# Patient Record
Sex: Female | Born: 2000 | Race: White | Hispanic: No | Marital: Single | Smoking: Never smoker
Health system: Southern US, Community
[De-identification: ages and names within clinical notes are randomized; demographics above are authoritative.]

## PROBLEM LIST (undated history)

## (undated) DIAGNOSIS — E079 Disorder of thyroid, unspecified: Secondary | ICD-10-CM

## (undated) HISTORY — DX: Disorder of thyroid, unspecified: E07.9

## (undated) HISTORY — PX: WISDOM TOOTH EXTRACTION: SHX21

---
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
## 2020-04-27 ENCOUNTER — Encounter (HOSPITAL_BASED_OUTPATIENT_CLINIC_OR_DEPARTMENT_OTHER): Payer: Self-pay | Admitting: Nurse Practitioner

## 2020-04-27 ENCOUNTER — Ambulatory Visit (INDEPENDENT_AMBULATORY_CARE_PROVIDER_SITE_OTHER): Payer: BC Managed Care – PPO | Admitting: Nurse Practitioner

## 2020-04-27 ENCOUNTER — Other Ambulatory Visit (HOSPITAL_BASED_OUTPATIENT_CLINIC_OR_DEPARTMENT_OTHER)
Admission: RE | Admit: 2020-04-27 | Discharge: 2020-04-27 | Disposition: A | Discharge status: Home / Self Care | Payer: BC Managed Care – PPO | Source: Ambulatory Visit | Attending: Nurse Practitioner | Admitting: Nurse Practitioner

## 2020-04-27 ENCOUNTER — Other Ambulatory Visit: Payer: Self-pay

## 2020-04-27 VITALS — BP 128/68 | HR 96 | Ht 64.0 in | Wt 196.0 lb

## 2020-04-27 DIAGNOSIS — Z7689 Persons encountering health services in other specified circumstances: Secondary | ICD-10-CM

## 2020-04-27 DIAGNOSIS — Z7184 Encounter for health counseling related to travel: Secondary | ICD-10-CM | POA: Diagnosis not present

## 2020-04-27 DIAGNOSIS — Z6833 Body mass index (BMI) 33.0-33.9, adult: Secondary | ICD-10-CM | POA: Diagnosis not present

## 2020-04-27 DIAGNOSIS — R946 Abnormal results of thyroid function studies: Secondary | ICD-10-CM | POA: Insufficient documentation

## 2020-04-27 DIAGNOSIS — Z0000 Encounter for general adult medical examination without abnormal findings: Secondary | ICD-10-CM

## 2020-04-27 DIAGNOSIS — Z020 Encounter for examination for admission to educational institution: Secondary | ICD-10-CM

## 2020-04-27 DIAGNOSIS — Z6834 Body mass index (BMI) 34.0-34.9, adult: Secondary | ICD-10-CM | POA: Insufficient documentation

## 2020-04-27 HISTORY — DX: Encounter for health counseling related to travel: Z71.84

## 2020-04-27 HISTORY — DX: Persons encountering health services in other specified circumstances: Z76.89

## 2020-04-27 HISTORY — DX: Encounter for examination for admission to educational institution: Z02.0

## 2020-04-27 LAB — TSH: TSH: 8.018 u[IU]/mL — ABNORMAL HIGH (ref 0.350–4.500)

## 2020-04-27 LAB — CBC WITH DIFFERENTIAL/PLATELET
Abs Immature Granulocytes: 0.01 10*3/uL (ref 0.00–0.07)
Basophils Absolute: 0 10*3/uL (ref 0.0–0.1)
Basophils Relative: 1 %
Eosinophils Absolute: 0 10*3/uL (ref 0.0–0.5)
Eosinophils Relative: 1 %
HCT: 32.8 % — ABNORMAL LOW (ref 36.0–46.0)
Hemoglobin: 10.2 g/dL — ABNORMAL LOW (ref 12.0–15.0)
Immature Granulocytes: 0 %
Lymphocytes Relative: 35 %
Lymphs Abs: 1.9 10*3/uL (ref 0.7–4.0)
MCH: 23.7 pg — ABNORMAL LOW (ref 26.0–34.0)
MCHC: 31.1 g/dL (ref 30.0–36.0)
MCV: 76.3 fL — ABNORMAL LOW (ref 80.0–100.0)
Monocytes Absolute: 0.6 10*3/uL (ref 0.1–1.0)
Monocytes Relative: 11 %
Neutro Abs: 2.9 10*3/uL (ref 1.7–7.7)
Neutrophils Relative %: 52 %
Platelets: 274 10*3/uL (ref 150–400)
RBC: 4.3 MIL/uL (ref 3.87–5.11)
RDW: 17 % — ABNORMAL HIGH (ref 11.5–15.5)
WBC: 5.5 10*3/uL (ref 4.0–10.5)
nRBC: 0 % (ref 0.0–0.2)

## 2020-04-27 NOTE — Assessment & Plan Note (Signed)
BMI 33.64 today. Patient is active, health female with no medical history. She is physically active with regular work out routine and good dietary habits. Will check TSH today for possible cause of BMI elevation. Recommend diet low in saturated fats and cholesterol. Continue exercise routine.  Follow-up once back from [place] for CPE.

## 2020-04-27 NOTE — Progress Notes (Signed)
New Patient Office Visit  Subjective:  Patient ID: Oluwaseun Bruyere, female    DOB: 01-16-2001  Age: 20 y.o. MRN: [id number]  CC:  Chief Complaint  Patient presents with  . Establish Care    HPI Artesha Wemhoff is a pleasant 20 year old female presenting today to establish care with this practice. 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 She lives on campus and reports that she feels safe in her environment.  She tells me that she eats a balanced diet and tried to incorporate a variety of food options in her meals. She works out with cardio training 1-2 days a week and weight lifting 3-4 days a week with each session between 30 minutes to an hour.  She denies alcohol, nicotine, or recreational drug use now or ever.  She is not currently sexually active and has no concerns with STI's.  She reports regular menstrual periods historically with no heavy flow or cramping. She was on OCP from about January 2021 through February 2022, but stopped this as she is not sexually active and did not feel the need for it. She does report that she has not had a menstrual period since stopping the medication.  She denies changes to her skin, bowel habits, or bladder habits.   She has not had a dental visit in about 2 years, but plans to make one soon. She does not have any vision problems and does not wear glasses. She has not had a vision exam in the last year.   She is unsure when her last tetanus booster was given, but reports she has been up to date on her vaccines historically.   She is planning to go to [place] to study abroad in September of this year and needs an exam, lab work, and chest x-ray to be cleared to go. She has brought paperwork for this to her office visit today.    History reviewed. No pertinent past medical history.  History reviewed. No pertinent surgical history.  History reviewed. No pertinent family history.  Social History    Socioeconomic History  . Marital status: Single    Spouse name: Not on file  . Number of children: Not on file  . Years of education: Not on file  . Highest education level: Not on file  Occupational History  . Not on file  Tobacco Use  . Smoking status: Not on file  . Smokeless tobacco: Not on file  Substance and Sexual Activity  . Alcohol use: Not on file  . Drug use: Not on file  . Sexual activity: Not on file  Other Topics Concern  . Not on file  Social History Narrative  . Not on file   Social Determinants of Health   Financial Resource Strain: Not on file  Food Insecurity: Not on file  Transportation Needs: Not on file  Physical Activity: Not on file  Stress: Not on file  Social Connections: Not on file  Intimate Partner Violence: Not on file    ROS Review of Systems  Constitutional: Negative for activity change, appetite change, chills, fatigue, fever and unexpected weight change.  HENT: Negative for congestion, hearing loss, rhinorrhea, sinus pressure, sinus pain, sore throat and trouble swallowing.   Eyes: Negative for visual disturbance.  Respiratory: Negative for cough, chest tightness, shortness of breath and wheezing.   Cardiovascular: Negative for chest pain, palpitations and leg swelling.  Gastrointestinal: Negative for abdominal distention, abdominal pain, constipation  and diarrhea.  Genitourinary: Negative for dysuria, flank pain, frequency, hematuria, menstrual problem, vaginal bleeding, vaginal discharge and vaginal pain.  Musculoskeletal: Negative for arthralgias, back pain and gait problem.  Skin: Negative for color change, pallor, rash and wound.  Neurological: Negative for dizziness, syncope, weakness, light-headedness, numbness and headaches.  Hematological: Negative for adenopathy. Does not bruise/bleed easily.  Psychiatric/Behavioral: Negative for decreased concentration, dysphoric mood, self-injury and sleep disturbance. The patient is not  nervous/anxious.     Objective:   Today's Vitals: BP 128/68   Pulse 96   Ht 5\' 4"  (1.626 m)   Wt 196 lb (88.9 kg)   SpO2 100%   BMI 33.64 kg/m   Physical Exam Vitals and nursing note reviewed.  Constitutional:      Appearance: Normal appearance. She is normal weight.  HENT:     Head: Normocephalic.     Right Ear: Tympanic membrane, ear canal and external ear normal.     Left Ear: Tympanic membrane, ear canal and external ear normal.     Nose: Nose normal. No congestion.     Mouth/Throat:     Mouth: Mucous membranes are moist.     Pharynx: Oropharynx is clear.  Eyes:     Extraocular Movements: Extraocular movements intact.     Conjunctiva/sclera: Conjunctivae normal.     Pupils: Pupils are equal, round, and reactive to light.  Neck:     Vascular: No carotid bruit.  Cardiovascular:     Rate and Rhythm: Normal rate and regular rhythm.     Pulses: Normal pulses.     Heart sounds: Normal heart sounds.  Pulmonary:     Effort: Pulmonary effort is normal.     Breath sounds: Normal breath sounds.  Abdominal:     General: Abdomen is flat. Bowel sounds are normal. There is no distension.     Palpations: Abdomen is soft.     Tenderness: There is no abdominal tenderness. There is no right CVA tenderness or left CVA tenderness.  Musculoskeletal:        General: Normal range of motion.     Cervical back: Normal range of motion. No rigidity.     Right lower leg: No edema.     Left lower leg: No edema.  Lymphadenopathy:     Cervical: No cervical adenopathy.  Skin:    General: Skin is warm and dry.     Capillary Refill: Capillary refill takes less than 2 seconds.  Neurological:     General: No focal deficit present.     Mental Status: She is alert and oriented to person, place, and time.  Psychiatric:        Mood and Affect: Mood normal.        Behavior: Behavior normal.        Thought Content: Thought content normal.        Judgment: Judgment normal.     Assessment &  Plan:   Problem List Items Addressed This Visit      Other   Encounter to establish care - Primary    New patient to practice with no medical history.  Review of personal and family medical history, social determinants of health, and physical exam performed today. No concerning findings.  Paperwork for school to be completed when lab and imaging results have been completed.  Will obtain previous records to evaluate for need of vaccinations- tetanus booster may be needed and pt was advised that she may need to return for nurse visit for  this.  Otherwise, follow-up in 1 year for CPE and pap or sooner if needed.       Relevant Orders   CBC with Differential/Platelet (Completed)   Comprehensive metabolic panel   TSH   DG Chest 2 View   Travel advice encounter    Travel planned in September of this year to [place] for educational purposes with plan to study abroad for 12 months.  Discussed recommendations for review of any travel advisories or vaccinations recommended prior to travel to the specific area and to notify the office if she needs assistance getting this.  Examination and labs performed based on recommendations from paperwork provided by educational facility.  No concerns on examination or from mental health standpoint. Recommended chest xray and lab work performed today.  Form to be completed and returned to patient when labs and imaging have returned.       Relevant Orders   CBC with Differential/Platelet (Completed)   Comprehensive metabolic panel   TSH   DG Chest 2 View   Body mass index (BMI) of 33.0-33.9 in adult    BMI 33.64 today. Patient is active, health female with no medical history. She is physically active with regular work out routine and good dietary habits. Will check TSH today for possible cause of BMI elevation. Recommend diet low in saturated fats and cholesterol. Continue exercise routine.  Follow-up once back from [place] for CPE.       Relevant Orders    Comprehensive metabolic panel   TSH   Encounter for school health examination    Examination and physical for clearance to travel to [place] for educational purposes with [facility].  Examination unremarkable, with no concerning findings.  Labs and required chest x-ray ordered.  Will complete form once results have been received.        Relevant Orders   CBC with Differential/Platelet (Completed)   Comprehensive metabolic panel   TSH   DG Chest 2 View      No outpatient encounter medications on file as of 04/27/2020.   No facility-administered encounter medications on file as of 04/27/2020.    Follow-up: Return in about 1 year (around 04/27/2021) for CPE with pap- Lab appointment today, please.   Tollie Eth, NP

## 2020-04-27 NOTE — Assessment & Plan Note (Signed)
Examination and physical for clearance to travel to [place] for educational purposes with [facility].  Examination unremarkable, with no concerning findings.  Labs and required chest x-ray ordered.  Will complete form once results have been received.

## 2020-04-27 NOTE — Assessment & Plan Note (Signed)
Travel planned in September of this year to [place] for educational purposes with plan to study abroad for 12 months.  Discussed recommendations for review of any travel advisories or vaccinations recommended prior to travel to the specific area and to notify the office if she needs assistance getting this.  Examination and labs performed based on recommendations from paperwork provided by educational facility.  No concerns on examination or from mental health standpoint. Recommended chest xray and lab work performed today.  Form to be completed and returned to patient when labs and imaging have returned.

## 2020-04-27 NOTE — Assessment & Plan Note (Signed)
New patient to practice with no medical history.  Review of personal and family medical history, social determinants of health, and physical exam performed today. No concerning findings.  Paperwork for school to be completed when lab and imaging results have been completed.  Will obtain previous records to evaluate for need of vaccinations- tetanus booster may be needed and pt was advised that she may need to return for nurse visit for this.  Otherwise, follow-up in 1 year for CPE and pap or sooner if needed.

## 2020-04-27 NOTE — Patient Instructions (Addendum)
Recommendations from today's visit: We will request your records from your pediatrician office to ensure that we keep up with the care that has been provided.   If we find that you are due for your tetanus vaccination, we will have you come in for a nurse visit to get that sometime before you leave.    Thank you for choosing [facility] for your Primary Care needs. I am excited for the opportunity to partner with you to meet your health care goals. It was a pleasure meeting you today!  I am an Adult-Geriatric Nurse Practitioner with a background in caring for patients for more than 20 years. I received my Paediatric nurse in Nursing and my Doctor of Nursing Practice degrees at [employer]. I received additional fellowship training in primary care and sports medicine after receiving my doctorate degree. I provide primary care and sports medicine services to patients age 21 and older within this office. I am also a provider with the [facility] and the director of the APP Fellowship with [facility].  I am a [place] native, but have called the [place] area home for nearly 20 years and am proud to be a member of this community.   I am passionate about providing the best service to you through preventive medicine and supportive care. I consider you a part of the medical team and value your input. I work diligently to ensure that you are heard and your needs are met in a safe and effective manner. I want you to feel comfortable with me as your provider and want you to know that your health concerns are important to me.   For your information, our office hours are Monday- Friday 8:00 AM - 5:00 PM At this time I am not in the office on Wednesdays.  If you have questions or concerns, please call our office at [phone] or send [place] a MyChart message and we will respond as quickly as possible.   For all urgent or time  sensitive needs we ask that you please call the office to avoid delays. MyChart is not constantly monitored and replies may take up to 72 business hours.  MyChart Policy: . MyChart allows for you to see your visit notes, after visit summary, provider recommendations, lab and tests results, make an appointment, request refills, and contact your provider or the office for non-urgent questions or concerns.  . Providers are seeing patients during normal business hours and do not have built in time to review MyChart messages. We ask that you allow a minimum of 72 business hours for MyChart message responses.  . Complex MyChart concerns may require a visit. Your provider may request you schedule a virtual or in person visit to ensure we are providing the best care possible. . MyChart messages sent after 4:00 PM on Friday will not be received by the provider until Monday morning.    Lab and Test Results: . You will receive your lab and test results on MyChart as soon as they are completed and results have been sent by the lab or testing facility. Due to this service, you will receive your results BEFORE your provider.  . Please allow a minimum of 72 business hours for your provider to receive and review lab and test results and contact you about.   . Most lab and test result comments from the provider will be sent through [place]. Your provider may recommend changes  to the plan of care, follow-up visits, repeat testing, ask questions, or request an office visit to discuss these results. You may reply directly to this message or call the office at [phone] to provide information for the provider or set up an appointment. . In some instances, you will be called with test results and recommendations. Please let us know if this is preferred and we will make note of this in your chart to provide this for you.    . If you have not heard a response to your lab or test results in 72 business hours, please call the  office to let us know.   After Hours: . For all non-emergency after hours needs, please call the office at [phone] and select the option to reach the on-call provider service. On-call services are shared between multiple [facility] offices and therefore it will not be possible to speak directly with your provider. On-call providers may provide medical advice and recommendations, but are unable to provide refills for maintenance medications.  . For all emergency or urgent medical needs after normal business hours, we recommend that you seek care at the closest Urgent Care or Emergency Department to ensure appropriate treatment in a timely manner.  [facility] [place] [facility] has a 24 hour emergency room located on the ground floor for your convenience.    Please do not hesitate to reach out to [place] with concerns.   Thank you, again, for choosing me as your health care partner. I appreciate your trust and look forward to learning more about you.   Worthy Keeler, DNP, AGNP-c ____________________________________________________________________________________________

## 2020-04-30 ENCOUNTER — Other Ambulatory Visit (HOSPITAL_BASED_OUTPATIENT_CLINIC_OR_DEPARTMENT_OTHER)
Admission: RE | Admit: 2020-04-30 | Discharge: 2020-04-30 | Disposition: A | Discharge status: Home / Self Care | Payer: BC Managed Care – PPO | Source: Ambulatory Visit | Attending: Nurse Practitioner | Admitting: Nurse Practitioner

## 2020-04-30 NOTE — Progress Notes (Signed)
Please call lab to add T4 to her labs for further evaluation of TSH levels.   Labs are showing presence of anemia. Recommend iron supplementation of Ferrous Sulfate 4 days a week. We can recheck in June to make sure she is improving before leaving for [place].

## 2020-05-01 ENCOUNTER — Other Ambulatory Visit (HOSPITAL_BASED_OUTPATIENT_CLINIC_OR_DEPARTMENT_OTHER): Payer: Self-pay

## 2020-05-01 ENCOUNTER — Other Ambulatory Visit (HOSPITAL_BASED_OUTPATIENT_CLINIC_OR_DEPARTMENT_OTHER): Payer: Self-pay | Admitting: Nurse Practitioner

## 2020-05-01 DIAGNOSIS — R7989 Other specified abnormal findings of blood chemistry: Secondary | ICD-10-CM

## 2020-05-01 LAB — T4, FREE: Free T4: 0.89 ng/dL (ref 0.61–1.12)

## 2020-05-02 ENCOUNTER — Other Ambulatory Visit: Payer: Self-pay

## 2020-05-02 ENCOUNTER — Other Ambulatory Visit (HOSPITAL_BASED_OUTPATIENT_CLINIC_OR_DEPARTMENT_OTHER): Payer: Self-pay | Admitting: Nurse Practitioner

## 2020-05-02 ENCOUNTER — Other Ambulatory Visit (HOSPITAL_BASED_OUTPATIENT_CLINIC_OR_DEPARTMENT_OTHER)
Admission: RE | Admit: 2020-05-02 | Discharge: 2020-05-02 | Disposition: A | Discharge status: Home / Self Care | Payer: BC Managed Care – PPO | Source: Ambulatory Visit | Attending: Nurse Practitioner | Admitting: Nurse Practitioner

## 2020-05-02 ENCOUNTER — Ambulatory Visit (HOSPITAL_BASED_OUTPATIENT_CLINIC_OR_DEPARTMENT_OTHER)
Admission: RE | Admit: 2020-05-02 | Discharge: 2020-05-02 | Disposition: A | Discharge status: Home / Self Care | Payer: BC Managed Care – PPO | Source: Ambulatory Visit | Attending: Nurse Practitioner | Admitting: Nurse Practitioner

## 2020-05-02 DIAGNOSIS — Z7689 Persons encountering health services in other specified circumstances: Secondary | ICD-10-CM

## 2020-05-02 DIAGNOSIS — Z7184 Encounter for health counseling related to travel: Secondary | ICD-10-CM | POA: Insufficient documentation

## 2020-05-02 DIAGNOSIS — R7989 Other specified abnormal findings of blood chemistry: Secondary | ICD-10-CM

## 2020-05-02 DIAGNOSIS — Z020 Encounter for examination for admission to educational institution: Secondary | ICD-10-CM | POA: Diagnosis present

## 2020-05-02 NOTE — Progress Notes (Signed)
TSH elevated with normal T4 findings on reflex order- adding thyroid peroxidase antibody test for further evaluation. Will await results to determine next course of action.

## 2020-05-02 NOTE — Progress Notes (Signed)
TSH is elevated, T4 normal. Suspect possible Hashimotos- added thyroid peroxidase antibody to labs for further evaluation. Will await results for follow-up.

## 2020-05-03 ENCOUNTER — Telehealth (HOSPITAL_BASED_OUTPATIENT_CLINIC_OR_DEPARTMENT_OTHER): Payer: Self-pay

## 2020-05-03 ENCOUNTER — Other Ambulatory Visit (HOSPITAL_BASED_OUTPATIENT_CLINIC_OR_DEPARTMENT_OTHER): Payer: Self-pay

## 2020-05-03 LAB — THYROID PEROXIDASE ANTIBODY: Thyroperoxidase Ab SerPl-aCnc: 207 IU/mL — ABNORMAL HIGH (ref 0–34)

## 2020-05-03 NOTE — Progress Notes (Signed)
Normal chest x-ray. OK for travel based on results. Will fill out form for completion. We will need to work to stabilize her new Hashimoto's diagnoses before she goes, but this will not inhibit her trip.

## 2020-05-03 NOTE — Telephone Encounter (Signed)
Patient returned phone call regarding lab results.  Scheduled follow up appointment with Shawna Clamp, AGNP.

## 2020-05-03 NOTE — Progress Notes (Signed)
Lab results consistent with Hashimotos Thyroiditis.  Please call patient today to schedule a follow-up in the next week so we can discuss the condition and medication management- this can be in person or MyChart.   Thank you!

## 2020-05-03 NOTE — Telephone Encounter (Signed)
-----   Message from Tollie Eth, NP sent at 05/03/2020  8:11 AM EDT ----- Lab results consistent with Hashimotos Thyroiditis.  Please call patient today to schedule a follow-up in the next week so we can discuss the condition and medication management- this can be in person or MyChart.   Thank you!

## 2020-05-07 ENCOUNTER — Ambulatory Visit (HOSPITAL_BASED_OUTPATIENT_CLINIC_OR_DEPARTMENT_OTHER): Payer: BC Managed Care – PPO | Admitting: Nurse Practitioner

## 2020-05-07 ENCOUNTER — Encounter (HOSPITAL_BASED_OUTPATIENT_CLINIC_OR_DEPARTMENT_OTHER): Payer: Self-pay | Admitting: Nurse Practitioner

## 2020-05-07 ENCOUNTER — Other Ambulatory Visit: Payer: Self-pay

## 2020-05-07 VITALS — BP 109/59 | HR 89 | Ht 64.0 in | Wt 197.6 lb

## 2020-05-07 DIAGNOSIS — Z6833 Body mass index (BMI) 33.0-33.9, adult: Secondary | ICD-10-CM | POA: Diagnosis not present

## 2020-05-07 DIAGNOSIS — E063 Autoimmune thyroiditis: Secondary | ICD-10-CM

## 2020-05-07 DIAGNOSIS — E038 Other specified hypothyroidism: Secondary | ICD-10-CM | POA: Diagnosis not present

## 2020-05-07 MED ORDER — LEVOTHYROXINE SODIUM 150 MCG PO TABS: 150.0000 ug | ORAL_TABLET | Freq: Every day | ORAL | 1 refills | Status: DC

## 2020-05-07 NOTE — Patient Instructions (Addendum)
Recommendations From Today's Visit:  Start Levothyroxine every morning.  You must take this on an empty stomach without any other medications with a glass of water. First thing in the morning is the best time.  Wait at least 30 minutes before eating or drinking anything else to allow the medication to work best.   Levothyroxine works best when you use the same brand of medication. Changing brands can cause variations in how this medication is absorbed. Your pharmacy will notify you if they are out of the brand you usually use. If you must change brands, then we will need to recheck labs after 6 weeks to make sure the dose is still correct.   Follow-Up: We will plan to follow-up with labs  6 weeks to see where your levels are to make sure that this dose is enough. We may have to increase the dose a bit if it is not well controlled at that point. You do not have to come into the office unless you are having problems at that time.     Hypothyroidism  Hypothyroidism is when the thyroid gland does not make enough of certain hormones (it is underactive). The thyroid gland is a small gland located in the lower front part of the neck, just in front of the windpipe (trachea). This gland makes hormones that help control how the body uses food for energy (metabolism) as well as how the heart and brain function. These hormones also play a role in keeping your bones strong. When the thyroid is underactive, it produces too little of the hormones thyroxine (T4) and triiodothyronine (T3). What are the causes? This condition may be caused by:  Hashimoto's disease. This is a disease in which the body's disease-fighting system (immune system) attacks the thyroid gland. This is the most common cause.  Viral infections.  Pregnancy.  Certain medicines.  Birth defects.  Past radiation treatments to the head or neck for cancer.  Past treatment with radioactive iodine.  Past exposure to radiation in  the environment.  Past surgical removal of part or all of the thyroid.  Problems with a gland in the center of the brain (pituitary gland).  Lack of enough iodine in the diet. What increases the risk? You are more likely to develop this condition if:  You are female.  You have a family history of thyroid conditions.  You use a medicine called lithium.  You take medicines that affect the immune system (immunosuppressants). What are the signs or symptoms? Symptoms of this condition include:  Feeling as though you have no energy (lethargy).  Not being able to tolerate cold.  Weight gain that is not explained by a change in diet or exercise habits.  Lack of appetite.  Dry skin.  Coarse hair.  Menstrual irregularity.  Slowing of thought processes.  Constipation.  Sadness or depression. How is this diagnosed? This condition may be diagnosed based on:  Your symptoms, your medical history, and a physical exam.  Blood tests. You may also have imaging tests, such as an ultrasound or MRI. How is this treated? This condition is treated with medicine that replaces the thyroid hormones that your body does not make. After you begin treatment, it may take several weeks for symptoms to go away. Follow these instructions at home:  Take over-the-counter and prescription medicines only as told by your health care provider.  If you start taking any new medicines, tell your health care provider.  Keep all follow-up visits as told  by your health care provider. This is important. ? As your condition improves, your dosage of thyroid hormone medicine may change. ? You will need to have blood tests regularly so that your health care provider can monitor your condition. Contact a health care provider if:  Your symptoms do not get better with treatment.  You are taking thyroid hormone replacement medicine and you: ? Sweat a lot. ? Have tremors. ? Feel anxious. ? Lose weight  rapidly. ? Cannot tolerate heat. ? Have emotional swings. ? Have diarrhea. ? Feel weak. Get help right away if you have:  Chest pain.  An irregular heartbeat.  A rapid heartbeat.  Difficulty breathing. Summary  Hypothyroidism is when the thyroid gland does not make enough of certain hormones (it is underactive).  When the thyroid is underactive, it produces too little of the hormones thyroxine (T4) and triiodothyronine (T3).  The most common cause is Hashimoto's disease, a disease in which the body's disease-fighting system (immune system) attacks the thyroid gland. The condition can also be caused by viral infections, medicine, pregnancy, or past radiation treatment to the head or neck.  Symptoms may include weight gain, dry skin, constipation, feeling as though you do not have energy, and not being able to tolerate cold.  This condition is treated with medicine to replace the thyroid hormones that your body does not make. This information is not intended to replace advice given to you by your health care provider. Make sure you discuss any questions you have with your health care provider. Document Revised: 10/21/2019 Document Reviewed: 10/06/2019 Elsevier Patient Education  2021 Elsevier Inc.    Levothyroxine tablets What is this medicine? LEVOTHYROXINE (lee voe thye ROX een) is a thyroid hormone. This medicine can improve symptoms of thyroid deficiency such as slow speech, lack of energy, weight gain, hair loss, dry skin, and feeling cold. It also helps to treat goiter (an enlarged thyroid gland). It is also used to treat some kinds of thyroid cancer along with surgery and other medicines. This medicine may be used for other purposes; ask your health care provider or pharmacist if you have questions. COMMON BRAND NAME(S): Estre, Euthyrox, Levo-T, Levothroid, Levoxyl, Synthroid, Thyro-Tabs, Unithroid What should I tell my health care provider before I take this medicine? They  need to know if you have any of these conditions:  Addison's disease or other adrenal gland problem  angina  bone problems  diabetes  dieting or on a weight loss program  fertility problems  heart disease  pituitary gland problem  take medicines that treat or prevent blood clots  an unusual or allergic reaction to levothyroxine, thyroid hormones, other medicines, foods, dyes, or preservatives  pregnant or trying to get pregnant  breast-feeding How should I use this medicine? Take this medicine by mouth with plenty of water. It is best to take on an empty stomach, at least 30 minutes to one hour before breakfast. Avoid taking antacids containing aluminum or magnesium, simethicone, bile acid sequestrants, calcium carbonate, sodium polystyrene sulfonate, ferrous sulfate, sevelamer, lanthanum, or sucralfate within 4 hours of taking this medicine. Follow the directions on the prescription label. Take at the same time each day. Do not take your medicine more often than directed. Contact your pediatrician regarding the use of this medicine in children. While this drug may be prescribed for children and infants as young as a few days of age for selected conditions, precautions do apply. For infants, you may crush the tablet and place in a  small amount of (5 to 10 mL or 1 to 2 teaspoonfuls) of water, breast milk, or non-soy based infant formula. Do not mix with soy-based infant formula. Give as directed. Overdosage: If you think you have taken too much of this medicine contact a poison control center or emergency room at once. NOTE: This medicine is only for you. Do not share this medicine with others. What if I miss a dose? If you miss a dose, take it as soon as you can. If it is almost time for your next dose, take only that dose. Do not take double or extra doses. What may interact with this medicine?  amiodarone  antacids  anti-thyroid medicines  calcium  supplements  carbamazepine  certain medicines for depression  certain medicines to treat cancer  cholestyramine  clofibrate  colesevelam  colestipol  digoxin  female hormones, like estrogens and birth control pills, patches, rings, or injections  iron supplements  ketamine  lanthanum  liquid nutrition products like Ensure  lithium  medicines for colds and breathing difficulties  medicines for diabetes  medicines or dietary supplements for weight loss  methadone  niacin  orlistat  oxandrolone  phenobarbital or other barbiturates  phenytoin  rifampin  sevelamer  simethicone  sodium polystyrene sulfonate  soy isoflavones  steroid medicines like prednisone or cortisone  sucralfate  testosterone  theophylline  warfarin This list may not describe all possible interactions. Give your health care provider a list of all the medicines, herbs, non-prescription drugs, or dietary supplements you use. Also tell them if you smoke, drink alcohol, or use illegal drugs. Some items may interact with your medicine. What should I watch for while using this medicine? Be sure to take this medicine with plenty of fluids. Some tablets may cause choking, gagging, or difficulty swallowing from the tablet getting stuck in your throat. Most of these problems disappear if the medicine is taken with the right amount of water or other fluids. Do not switch brands of this medicine unless your health care professional agrees with the change. Ask questions if you are uncertain. You will need regular exams and occasional blood tests to check the response to treatment. If you are receiving this medicine for an underactive thyroid, it may be several weeks before you notice an improvement. Check with your doctor or health care professional if your symptoms do not improve. It may be necessary for you to take this medicine for the rest of your life. Do not stop using this medicine  unless your doctor or health care professional advises you to. This medicine can affect blood sugar levels. If you have diabetes, check your blood sugar as directed. You may lose some of your hair when you first start treatment. With time, this usually corrects itself. If you are going to have surgery, tell your doctor or health care professional that you are taking this medicine. What side effects may I notice from receiving this medicine? Side effects that you should report to your doctor or health care professional as soon as possible:  allergic reactions like skin rash, itching or hives, swelling of the face, lips, or tongue  anxious  breathing problems  changes in menstrual periods  chest pain  diarrhea  excessive sweating or intolerance to heat  fast or irregular heartbeat  leg cramps  nervousness  swelling of ankles, feet, or legs  tremors  trouble sleeping  vomiting Side effects that usually do not require medical attention (report to your doctor or health care  professional if they continue or are bothersome):  changes in appetite  headache  irritable  nausea  weight loss This list may not describe all possible side effects. Call your doctor for medical advice about side effects. You may report side effects to FDA at 1-800-FDA-1088. Where should I keep my medicine? Keep out of the reach of children. Store at room temperature between 15 and 30 degrees C (59 and 86 degrees F). Protect from light and moisture. Keep container tightly closed. Throw away any unused medicine after the expiration date. NOTE: This sheet is a summary. It may not cover all possible information. If you have questions about this medicine, talk to your doctor, pharmacist, or health care provider.  2021 Elsevier/Gold Standard (2019-01-13 13:39:26)

## 2020-05-07 NOTE — Progress Notes (Signed)
Established Patient Office Visit  Subjective:  Patient ID: Kristie Bean, female    DOB: 27-Oct-2000  Age: 20 y.o. MRN: [id number]  CC:  Chief Complaint  Patient presents with  . Follow-up    HPI Kristie Bean presents for follow-up for new diagnosis of hypothyroidism determined with labs from recent visit. 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 She recently had a physical examination with labs as part of evaluation for medical clearance to attend school in [place] for a year as part of an exchange program.  Lab results revealed elevation of TSH with subsequent findings elevated thyroperoxidase antibodies consistent with Hashimoto's thyroiditis.  She does endorse difficulty losing weight with weight gain despite regular physical activity and strict monitoring of her diet.  She also endorses fatigue and dry skin.  She reports that she thought all of these issues were normal for her until she received the lab results.  She does have other family members with Hashimoto's thyroiditis and other autoimmune conditions that she recently discovered.  History reviewed. No pertinent past medical history.  History reviewed. No pertinent surgical history.  Family History  Problem Relation Age of Onset  . Cancer Maternal Aunt   . Diabetes Maternal Aunt     Social History   Socioeconomic History  . Marital status: Single    Spouse name: Not on file  . Number of children: Not on file  . Years of education: Not on file  . Highest education level: Not on file  Occupational History  . Occupation: Ship broker  Tobacco Use  . Smoking status: Never Smoker  . Smokeless tobacco: Never Used  Vaping Use  . Vaping Use: Never used  Substance and Sexual Activity  . Alcohol use: Never  . Drug use: Never  . Sexual activity: Not Currently    Birth control/protection: Abstinence  Other Topics Concern  . Not on file  Social History Narrative   Sophomore at [employer] with plans to travel and study in [place] for 12 months starting this September.    Social Determinants of Health   Financial Resource Strain: Not on file  Food Insecurity: Not on file  Transportation Needs: Not on file  Physical Activity: Sufficiently Active  . Days of Exercise per Week: 7 days  . Minutes of Exercise per Session: 50 min  Stress: Not on file  Social Connections: Not on file  Intimate Partner Violence: Not At Risk  . Fear of Current or Ex-Partner: No  . Emotionally Abused: No  . Physically Abused: No  . Sexually Abused: No    No outpatient medications prior to visit.   No facility-administered medications prior to visit.    Not on File  ROS Review of Systems  Constitutional: Positive for fatigue and unexpected weight change. Negative for activity change and appetite change.  Respiratory: Negative for cough, chest tightness, shortness of breath and wheezing.   Cardiovascular: Negative for chest pain, palpitations and leg swelling.  Gastrointestinal: Negative for abdominal pain, constipation and diarrhea.  Musculoskeletal: Negative for arthralgias and myalgias.  Neurological: Negative for dizziness, tremors, weakness, light-headedness and headaches.  Hematological: Negative for adenopathy. Does not bruise/bleed easily.  Psychiatric/Behavioral: Negative for dysphoric mood, self-injury and sleep disturbance. The patient is not nervous/anxious.       Objective:    Physical Exam Vitals and nursing note reviewed.  Constitutional:      Appearance: Normal appearance.  HENT:     Head: Normocephalic.  Eyes:     Extraocular Movements: Extraocular movements intact.     Conjunctiva/sclera: Conjunctivae normal.     Pupils: Pupils are equal, round, and reactive to light.  Neck:     Vascular: No carotid bruit.  Cardiovascular:     Rate and Rhythm: Normal rate and regular rhythm.     Pulses: Normal pulses.     Heart sounds: Normal  heart sounds. No murmur heard.   Pulmonary:     Effort: Pulmonary effort is normal.     Breath sounds: Normal breath sounds.  Abdominal:     General: Abdomen is flat. Bowel sounds are normal. There is no distension.     Palpations: Abdomen is soft.     Tenderness: There is no abdominal tenderness.  Musculoskeletal:     Cervical back: No rigidity or tenderness.     Right lower leg: No edema.     Left lower leg: No edema.  Lymphadenopathy:     Cervical: No cervical adenopathy.  Skin:    General: Skin is warm and dry.     Capillary Refill: Capillary refill takes less than 2 seconds.  Neurological:     General: No focal deficit present.     Mental Status: She is alert and oriented to person, place, and time.  Psychiatric:        Mood and Affect: Mood normal.        Behavior: Behavior normal.        Thought Content: Thought content normal.        Judgment: Judgment normal.     BP (!) 109/59   Pulse 89   Ht '5\' 4"'  (1.626 m)   Wt 197 lb 9.6 oz (89.6 kg)   SpO2 100%   BMI 33.92 kg/m  Wt Readings from Last 3 Encounters:  05/07/20 197 lb 9.6 oz (89.6 kg)  04/27/20 196 lb (88.9 kg)     Health Maintenance Due  Topic Date Due  . Hepatitis C Screening  Never done  . COVID-19 Vaccine (1) Never done  . HPV VACCINES (1 - 2-dose series) Never done  . HIV Screening  Never done  . TETANUS/TDAP  Never done       Topic Date Due  . HPV VACCINES (1 - 2-dose series) Never done    Lab Results  Component Value Date   TSH 8.018 (H) 04/27/2020   Lab Results  Component Value Date   WBC 5.5 04/27/2020   HGB 10.2 (L) 04/27/2020   HCT 32.8 (L) 04/27/2020   MCV 76.3 (L) 04/27/2020   PLT 274 04/27/2020   No results found for: NA, K, CHLORIDE, CO2, GLUCOSE, BUN, CREATININE, BILITOT, ALKPHOS, AST, ALT, PROT, ALBUMIN, CALCIUM, ANIONGAP, EGFR, GFR No results found for: CHOL No results found for: HDL No results found for: LDLCALC No results found for: TRIG No results found for:  CHOLHDL No results found for: HGBA1C    Assessment & Plan:   Problem List Items Addressed This Visit      Endocrine   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 Patient educated today on this condition as well as recommendations for management. Recommend starting with levothyroxine 150 mg based on weight with follow-up monitoring of TSH levels in approximately 6 weeks. She will need frequent monitoring to help stabilize her as quickly as possible as she is expected to leave in late September for [place] for a full year. She will  have access to medical care in [place] therefore we may not have to have a full years worth of medication available when she leaves however she is going to check on this so that we can come up with the best plan of care prior to her departure. Education information provided and all questions answered today. Follow-up in 6 weeks with labs.      Relevant Medications   levothyroxine (SYNTHROID) 150 MCG tablet   Other Relevant Orders   Thyroid Panel With TSH     Other   Body mass index (BMI) of 33.0-33.9 in adult    BMI 33.92 at visit today. New recent diagnosis of Hashimoto's thyroiditis does help explain elevation in BMI despite regular workout routine and excellent dietary habits. Discussed with the patient that medication management may help with her weight gain, but to monitor for signs of hyperthyroidism and let me know immediately if these changes present.  Will plan to follow-up in 6 weeks with repeat labs.  Recommend continuing current diet and exercise program.          Meds ordered this encounter  Medications  . DISCONTD: levothyroxine (SYNTHROID) 150 MCG tablet    Sig: Take 1 tablet (150 mcg total) by mouth daily before breakfast.    Dispense:  30 tablet    Refill:  1  . levothyroxine (SYNTHROID) 150 MCG tablet    Sig: Take 1 tablet (150 mcg total) by  mouth daily before breakfast.    Dispense:  30 tablet    Refill:  1    Brand specific please    Follow-up: Return in about 6 weeks (around 06/18/2020) for Labs only in 6 weeks.    Orma Render, NP

## 2020-05-07 NOTE — Assessment & Plan Note (Signed)
BMI 33.92 at visit today. New recent diagnosis of Hashimoto's thyroiditis does help explain elevation in BMI despite regular workout routine and excellent dietary habits. Discussed with the patient that medication management may help with her weight gain, but to monitor for signs of hyperthyroidism and let me know immediately if these changes present.  Will plan to follow-up in 6 weeks with repeat labs.  Recommend continuing current diet and exercise program.

## 2020-05-07 NOTE — Assessment & Plan Note (Addendum)
New diagnosis of Hashimoto's thyroiditis with recent laboratory evaluation. Patient educated today on this condition as well as recommendations for management. Recommend starting with levothyroxine 150 mg based on weight with follow-up monitoring of TSH levels in approximately 6 weeks. She will need frequent monitoring to help stabilize her as quickly as possible as she is expected to leave in late September for [place] for a full year. Consult with pharmacist to determine the best course of action for medication management while she is away.  It does appear that we can get a prior authorization from her insurance company to supply her with a full 1 year supply of medication before she leaves.  If there is an issue with her obtaining medical care while she is gone this will be required.  We may also consider mailing her medication in 3 or 6 months supplies to ensure that she does not run out. She will have access to medical care in [place] therefore we may not have to have a full years worth of medication available when she leaves however she is going to check on this so that we can come up with the best plan of care prior to her departure. Education information provided and all questions answered today. Follow-up in 6 weeks with labs.

## 2020-05-08 ENCOUNTER — Encounter (HOSPITAL_BASED_OUTPATIENT_CLINIC_OR_DEPARTMENT_OTHER): Payer: Self-pay | Admitting: Nurse Practitioner

## 2020-05-08 ENCOUNTER — Other Ambulatory Visit (HOSPITAL_BASED_OUTPATIENT_CLINIC_OR_DEPARTMENT_OTHER): Payer: Self-pay | Admitting: Nurse Practitioner

## 2020-05-08 DIAGNOSIS — E038 Other specified hypothyroidism: Secondary | ICD-10-CM

## 2020-05-08 DIAGNOSIS — E063 Autoimmune thyroiditis: Secondary | ICD-10-CM

## 2020-05-08 MED ORDER — LEVOTHYROXINE SODIUM 150 MCG PO TABS: 150.0000 ug | ORAL_TABLET | Freq: Every day | ORAL | 3 refills | Status: DC

## 2020-05-09 ENCOUNTER — Encounter (HOSPITAL_BASED_OUTPATIENT_CLINIC_OR_DEPARTMENT_OTHER): Payer: Self-pay

## 2020-06-29 ENCOUNTER — Other Ambulatory Visit (HOSPITAL_BASED_OUTPATIENT_CLINIC_OR_DEPARTMENT_OTHER): Payer: Self-pay | Admitting: Nurse Practitioner

## 2020-06-29 DIAGNOSIS — E038 Other specified hypothyroidism: Secondary | ICD-10-CM

## 2020-07-31 ENCOUNTER — Other Ambulatory Visit (HOSPITAL_BASED_OUTPATIENT_CLINIC_OR_DEPARTMENT_OTHER)
Admission: RE | Admit: 2020-07-31 | Discharge: 2020-07-31 | Disposition: A | Discharge status: Home / Self Care | Payer: BC Managed Care – PPO | Source: Ambulatory Visit | Attending: Nurse Practitioner | Admitting: Nurse Practitioner

## 2020-07-31 ENCOUNTER — Ambulatory Visit (HOSPITAL_BASED_OUTPATIENT_CLINIC_OR_DEPARTMENT_OTHER): Payer: BC Managed Care – PPO | Admitting: Nurse Practitioner

## 2020-07-31 ENCOUNTER — Encounter (HOSPITAL_BASED_OUTPATIENT_CLINIC_OR_DEPARTMENT_OTHER): Payer: Self-pay | Admitting: Nurse Practitioner

## 2020-07-31 ENCOUNTER — Other Ambulatory Visit: Payer: Self-pay

## 2020-07-31 VITALS — BP 119/63 | HR 76 | Ht 64.0 in | Wt 199.2 lb

## 2020-07-31 DIAGNOSIS — E038 Other specified hypothyroidism: Secondary | ICD-10-CM

## 2020-07-31 DIAGNOSIS — E559 Vitamin D deficiency, unspecified: Secondary | ICD-10-CM

## 2020-07-31 DIAGNOSIS — E063 Autoimmune thyroiditis: Secondary | ICD-10-CM | POA: Diagnosis not present

## 2020-07-31 DIAGNOSIS — D509 Iron deficiency anemia, unspecified: Secondary | ICD-10-CM | POA: Diagnosis not present

## 2020-07-31 NOTE — Progress Notes (Signed)
Established Patient Office Visit  Subjective:  Patient ID: Kristie Bean, female    DOB: 01/15/2001  Age: 20 y.o. MRN: [id number]  CC:  Chief Complaint  Patient presents with   Follow-up    Medication follow up    HPI Kristie Bean presents for hypothyroidism and anemia.  [name] was found to have Hashimoto's thyroiditis after establishing care and initial labs on 04/27/2020.  At that time she was started on levothyroxine 125 mcg/day.  She reports that she has been taking the medication daily at least 1 hour prior to her first meal and has not skipped any doses. She does report that she has some improvement with her stamina however she does not feel that her energy levels have improved much since starting the medication.  She also reports that she has been noticing fluctuations in her weight up to 5 pounds at a time over short periods of time (typically 1 week) she is unsure if this has anything to do with her thyroid or is due to some other factor. She also reports irregular menstrual cycles with no improvement since starting the medication.  She states that her menstrual periods typically last for about 1 week and are particularly heavy at the beginning and middle of her cycle.  She says that this has been a problem for quite some time. She denies thinning hair, intolerance to heat or cold, palpitations, chest pain, dizziness, weakness, mood fluctuations.  [name] was also found to be positive for iron deficiency anemia with her labs in March.  She was started on ferrous sulfate 325 mcg every other day.  She reports she has been taking this medication as directed with no known side effects.  She does report that she has heavy menstrual cycles however they are irregular. She has no other source of bleeding, no black tarry stools, no dark emesis, no bruising.  History reviewed. No pertinent past medical history.  History reviewed. No pertinent surgical history.  Family History   Problem Relation Age of Onset   Diabetes Maternal Aunt    Breast cancer Maternal Aunt    Coronary artery disease Maternal Grandfather     Social History   Socioeconomic History   Marital status: Single    Spouse name: Not on file   Number of children: Not on file   Years of education: Not on file   Highest education level: Not on file  Occupational History   Occupation: Student  Tobacco Use   Smoking status: Never   Smokeless tobacco: Never  Vaping Use   Vaping Use: Never used  Substance and Sexual Activity   Alcohol use: Never   Drug use: Never   Sexual activity: Not Currently    Birth control/protection: Abstinence  Other Topics Concern   Not on file  Social History Narrative   Sophomore at [employer] with plans to travel and study in [place] for 12 months starting this September.    Social Determinants of Health   Financial Resource Strain: Not on file  Food Insecurity: Not on file  Transportation Needs: Not on file  Physical Activity: Sufficiently Active   Days of Exercise per Week: 7 days   Minutes of Exercise per Session: 50 min  Stress: Not on file  Social Connections: Not on file  Intimate Partner Violence: Not At Risk   Fear of Current or Ex-Partner: No   Emotionally Abused: No   Physically Abused: No   Sexually Abused: No    Outpatient  Medications Prior to Visit  Medication Sig Dispense Refill   levothyroxine (SYNTHROID) 150 MCG tablet Take 1 tablet (150 mcg total) by mouth daily before breakfast. 90 tablet 3   No facility-administered medications prior to visit.    Not on File  ROS Review of Systems  Constitutional:  Positive for fatigue and unexpected weight change. Negative for activity change, appetite change and fever.  Respiratory:  Negative for chest tightness and shortness of breath.   Cardiovascular:  Negative for chest pain, palpitations and leg swelling.  Gastrointestinal:  Negative for abdominal pain.   Endocrine: Negative for cold intolerance, heat intolerance, polydipsia, polyphagia and polyuria.  Genitourinary:  Positive for menstrual problem. Negative for dysuria and frequency.       Irregular menses   Skin:  Negative for pallor.  Neurological:  Negative for dizziness, weakness and headaches.  Psychiatric/Behavioral:  Negative for dysphoric mood and sleep disturbance. The patient is not nervous/anxious.      Objective:    Physical Exam Vitals and nursing note reviewed.  Constitutional:      Appearance: Normal appearance.  HENT:     Head: Normocephalic and atraumatic.  Eyes:     Extraocular Movements: Extraocular movements intact.     Conjunctiva/sclera: Conjunctivae normal.     Pupils: Pupils are equal, round, and reactive to light.  Cardiovascular:     Rate and Rhythm: Normal rate and regular rhythm.     Pulses: Normal pulses.     Heart sounds: Normal heart sounds.  Pulmonary:     Effort: Pulmonary effort is normal.     Breath sounds: Normal breath sounds.  Abdominal:     General: Abdomen is flat. Bowel sounds are normal.     Palpations: Abdomen is soft.  Musculoskeletal:        General: Normal range of motion.     Cervical back: Normal range of motion.     Right lower leg: No edema.     Left lower leg: No edema.  Skin:    General: Skin is warm and dry.     Capillary Refill: Capillary refill takes less than 2 seconds.  Neurological:     General: No focal deficit present.     Mental Status: She is alert and oriented to person, place, and time.  Psychiatric:        Mood and Affect: Mood normal.        Behavior: Behavior normal.        Thought Content: Thought content normal.        Judgment: Judgment normal.    BP 119/63   Pulse 76   Ht '5\' 4"'  (1.626 m)   Wt 199 lb 3.2 oz (90.4 kg)   BMI 34.19 kg/m  Wt Readings from Last 3 Encounters:  07/31/20 199 lb 3.2 oz (90.4 kg)  05/07/20 197 lb 9.6 oz (89.6 kg)  04/27/20 196 lb (88.9 kg)     Health Maintenance  Due  Topic Date Due   HPV VACCINES (1 - 2-dose series) Never done   HIV Screening  Never done   Hepatitis C Screening  Never done   TETANUS/TDAP  Never done   COVID-19 Vaccine (3 - Booster for Moderna series) 10/20/2019   PAP-Cervical Cytology Screening  06/20/2020   PAP SMEAR-Modifier  06/20/2020       Topic Date Due   HPV VACCINES (1 - 2-dose series) Never done    Lab Results  Component Value Date   TSH 8.018 (H) 04/27/2020  Lab Results  Component Value Date   WBC 5.5 04/27/2020   HGB 10.2 (L) 04/27/2020   HCT 32.8 (L) 04/27/2020   MCV 76.3 (L) 04/27/2020   PLT 274 04/27/2020   No results found for: NA, K, CHLORIDE, CO2, GLUCOSE, BUN, CREATININE, BILITOT, ALKPHOS, AST, ALT, PROT, ALBUMIN, CALCIUM, ANIONGAP, EGFR, GFR No results found for: CHOL No results found for: HDL No results found for: LDLCALC No results found for: TRIG No results found for: CHOLHDL No results found for: HGBA1C    Assessment & Plan:   Problem List Items Addressed This Visit     Hypothyroidism due to Hashimoto's thyroiditis   Other Visit Diagnoses     Iron deficiency anemia, unspecified iron deficiency anemia type    -  Primary   Relevant Orders   CBC with Differential       No orders of the defined types were placed in this encounter.   Follow-up: Return in about 6 weeks (around 09/11/2020) for Thyroid/Anemia.    Orma Render, NP

## 2020-07-31 NOTE — Assessment & Plan Note (Signed)
New diagnosis of Hashimoto's thyroiditis in late March of this year. Started on levothyroxine 150 mcg on 05/07/2020. Has not noticed any significant improvement in symptoms since starting the medication.  We will check labs today. Will make changes to medication and dosage based on laboratory results.  Suspect that we will likely need to increase the dose. 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 She will need to have a 3 to 27-month supply of medication initially sent with her to get her through until mail order prescriptions can be sent.  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 Plan to follow-up in 6 weeks for labs and reevaluation.

## 2020-07-31 NOTE — Patient Instructions (Addendum)
Let's plan to see what your labs say and then we will have time to make any changes. If we need to make changes I will let you know.   I recommend that you continue to take the iron supplement at least until we get the labs back. You can continue three times a week as you have been  I recommend taking Vitamin D supplement 800iU per day at the opposite time of day as your iron.  This will help with low Vitamin D levels and protect your immune system a little while traveling.   If you decide that you would like to try a birth control option to help control your periods better, please let me know and I can send this in for you.

## 2020-07-31 NOTE — Assessment & Plan Note (Signed)
History of vitamin D deficiency. Recommend 800 IU of vitamin D daily to be added to her medication regimen to help prevent worsening of this condition particularly since she is getting ready to leave the country for an extended period of time.  We will plan to recheck vitamin D labs at her next appointment in 6 weeks.

## 2020-07-31 NOTE — Assessment & Plan Note (Signed)
Recent diagnosis of iron deficiency anemia started on ferrous sulfate 325 mg 3 days a week.  At this time patient has been taking medication for 6 weeks. Labs drawn today. Recommend continue medication until lab results have returned. Suspect that the patient will need to remain on medication given that she is having irregular and heavy menstrual bleeding. Discussed oral contraceptive management with the patient to help stabilize her menstrual cycle.  At this time she wishes to think about this. I am hopeful that stabilization of her thyroid condition will result in normalization of her menses. Follow-up in 6 weeks.

## 2020-08-01 ENCOUNTER — Other Ambulatory Visit (HOSPITAL_BASED_OUTPATIENT_CLINIC_OR_DEPARTMENT_OTHER): Payer: Self-pay | Admitting: Nurse Practitioner

## 2020-08-01 ENCOUNTER — Telehealth (HOSPITAL_BASED_OUTPATIENT_CLINIC_OR_DEPARTMENT_OTHER): Payer: Self-pay

## 2020-08-01 DIAGNOSIS — E038 Other specified hypothyroidism: Secondary | ICD-10-CM

## 2020-08-01 LAB — THYROID PANEL WITH TSH
Free Thyroxine Index: 5.3 — ABNORMAL HIGH (ref 1.2–4.9)
T3 Uptake Ratio: 36 % (ref 24–39)
T4, Total: 14.8 ug/dL — ABNORMAL HIGH (ref 4.5–12.0)
TSH: 0.009 u[IU]/mL — ABNORMAL LOW (ref 0.450–4.500)

## 2020-08-01 LAB — CBC WITH DIFFERENTIAL/PLATELET
Basophils Absolute: 0 10*3/uL (ref 0.0–0.2)
Basos: 0 %
EOS (ABSOLUTE): 0.1 10*3/uL (ref 0.0–0.4)
Eos: 1 %
Hematocrit: 41.7 % (ref 34.0–46.6)
Hemoglobin: 13.6 g/dL (ref 11.1–15.9)
Immature Grans (Abs): 0 10*3/uL (ref 0.0–0.1)
Immature Granulocytes: 0 %
Lymphocytes Absolute: 2 10*3/uL (ref 0.7–3.1)
Lymphs: 32 %
MCH: 27.9 pg (ref 26.6–33.0)
MCHC: 32.6 g/dL (ref 31.5–35.7)
MCV: 86 fL (ref 79–97)
Monocytes Absolute: 0.5 10*3/uL (ref 0.1–0.9)
Monocytes: 9 %
Neutrophils Absolute: 3.6 10*3/uL (ref 1.4–7.0)
Neutrophils: 58 %
Platelets: 263 10*3/uL (ref 150–450)
RBC: 4.87 x10E6/uL (ref 3.77–5.28)
RDW: 14.3 % (ref 11.7–15.4)
WBC: 6.2 10*3/uL (ref 3.4–10.8)

## 2020-08-01 MED ORDER — LEVOTHYROXINE SODIUM 125 MCG PO TABS: 125.0000 ug | ORAL_TABLET | Freq: Every day | ORAL | 1 refills | Status: DC

## 2020-08-01 NOTE — Telephone Encounter (Signed)
-----   Message from Tollie Eth, NP sent at 08/01/2020 11:59 AM EDT ----- Please call patient:  Anemia is much improved! Continue to take the iron three days a week as you have been.   Thyroid levels have dropped lower than I would like to see them.  Changing dose to levothyroxine .  Stop tablets and start tablets.  We will recheck labs in 6 weeks as planned.   Future lab orders placed- appt has been made for f/u

## 2020-08-01 NOTE — Progress Notes (Signed)
Please call patient:  Anemia is much improved! Continue to take the iron three days a week as you have been.   Thyroid levels have dropped lower than I would like to see them.  Changing dose to levothyroxine .  Stop tablets and start tablets.  We will recheck labs in 6 weeks as planned.   Future lab orders placed- appt has been made for f/u

## 2020-08-01 NOTE — Telephone Encounter (Signed)
Results released by Shawna Clamp DNP and reviewed by patient via MyChart Instructed patient to contact the office with any questions or concerns. Patient is aware and agreeable to dose change of Levothyroxine and rechecking labs at next office visit

## 2020-08-02 ENCOUNTER — Encounter (HOSPITAL_BASED_OUTPATIENT_CLINIC_OR_DEPARTMENT_OTHER): Payer: Self-pay | Admitting: Nurse Practitioner

## 2020-08-26 ENCOUNTER — Encounter (HOSPITAL_BASED_OUTPATIENT_CLINIC_OR_DEPARTMENT_OTHER): Payer: Self-pay | Admitting: Nurse Practitioner

## 2020-08-27 ENCOUNTER — Other Ambulatory Visit (HOSPITAL_BASED_OUTPATIENT_CLINIC_OR_DEPARTMENT_OTHER): Payer: Self-pay | Admitting: Nurse Practitioner

## 2020-08-27 DIAGNOSIS — E038 Other specified hypothyroidism: Secondary | ICD-10-CM

## 2020-09-11 ENCOUNTER — Ambulatory Visit (HOSPITAL_BASED_OUTPATIENT_CLINIC_OR_DEPARTMENT_OTHER): Payer: BC Managed Care – PPO | Admitting: Nurse Practitioner

## 2020-09-11 ENCOUNTER — Other Ambulatory Visit: Payer: Self-pay

## 2020-09-11 ENCOUNTER — Encounter (HOSPITAL_BASED_OUTPATIENT_CLINIC_OR_DEPARTMENT_OTHER): Payer: Self-pay | Admitting: Nurse Practitioner

## 2020-09-11 VITALS — BP 136/71 | HR 99 | Ht 64.0 in | Wt 206.4 lb

## 2020-09-11 DIAGNOSIS — Z130 Encounter for screening for diseases of the blood and blood-forming organs and certain disorders involving the immune mechanism: Secondary | ICD-10-CM

## 2020-09-11 DIAGNOSIS — E063 Autoimmune thyroiditis: Secondary | ICD-10-CM

## 2020-09-11 DIAGNOSIS — E038 Other specified hypothyroidism: Secondary | ICD-10-CM

## 2020-09-11 NOTE — Progress Notes (Signed)
Established Patient Office Visit  Subjective:  Patient ID: Kristie Bean, female    DOB: 01-08-2001  Age: 20 y.o. MRN: [id number]  CC:  Chief Complaint  Patient presents with   Follow-up    Thyroid follow up    HPI Kristie Bean presents for follow-up for hypothyroidism. She leaves for [place] in [place] September. Has been taking levothyroxine 17mg daily without any SE Reports that she did notice about a 5lb weight gain overnight recently and is not sure what caused it. Reports increase in headaches recently, with no known changes to vision, dizziness, or other symptoms Overall been sleeping well, but did have a 4-5 night span with interrupted sleep.  No worsening anxiety, palpitations, hair loss, constipation, intolerance to heat/cold, or diarrhea  History reviewed. No pertinent past medical history.  History reviewed. No pertinent surgical history.  Family History  Problem Relation Age of Onset   Diabetes Maternal Aunt    Breast cancer Maternal Aunt    Coronary artery disease Maternal Grandfather     Social History   Socioeconomic History   Marital status: Single    Spouse name: Not on file   Number of children: Not on file   Years of education: Not on file   Highest education level: Not on file  Occupational History   Occupation: Student  Tobacco Use   Smoking status: Never   Smokeless tobacco: Never  Vaping Use   Vaping Use: Never used  Substance and Sexual Activity   Alcohol use: Never   Drug use: Never   Sexual activity: Not Currently    Birth control/protection: Abstinence  Other Topics Concern   Not on file  Social History Narrative   Sophomore at [employer] [place]    Social Determinants of Health   Financial Resource Strain: Not on file  Food Insecurity: Not on file  Transportation Needs: Not on file  Physical Activity: Sufficiently Active    Days of Exercise per Week: 7 days   Minutes of Exercise per Session: 50 min  Stress: Not on file  Social Connections: Not on file  Intimate Partner Violence: Not At Risk   Fear of Current or Ex-Partner: No   Emotionally Abused: No   Physically Abused: No   Sexually Abused: No    Outpatient Medications Prior to Visit  Medication Sig Dispense Refill   levothyroxine (SYNTHROID) 125 MCG tablet Take 1 tablet (125 mcg total) by mouth daily before breakfast. 30 tablet 1   No facility-administered medications prior to visit.    Not on File  ROS Review of Systems All review of systems negative except what is listed in the HPI    Objective:    Physical Exam Vitals and nursing note reviewed.  Constitutional:      Appearance: Normal appearance.  HENT:     Head: Normocephalic and atraumatic.  Eyes:     Extraocular Movements: Extraocular movements intact.     Conjunctiva/sclera: Conjunctivae normal.     Pupils: Pupils are equal, round, and reactive to light.  Neck:     Vascular: No carotid bruit.  Cardiovascular:     Rate and Rhythm: Normal rate and regular rhythm.     Pulses: Normal pulses.     Heart sounds: Normal heart sounds.  Pulmonary:     Effort: Pulmonary effort is normal.     Breath sounds: Normal breath sounds.  Abdominal:  General: Abdomen is flat. Bowel sounds are normal.     Palpations: Abdomen is soft.  Musculoskeletal:        General: Normal range of motion.     Cervical back: Normal range of motion and neck supple. No tenderness.     Right lower leg: No edema.     Left lower leg: No edema.  Lymphadenopathy:     Cervical: No cervical adenopathy.  Skin:    General: Skin is warm and dry.     Capillary Refill: Capillary refill takes less than 2 seconds.  Neurological:     General: No focal deficit present.     Mental Status: She is alert and oriented to person, place, and time.  Psychiatric:        Mood and Affect: Mood normal.        Behavior:  Behavior normal.        Thought Content: Thought content normal.        Judgment: Judgment normal.    BP 136/71   Pulse 99   Ht '5\' 4"'  (1.626 m)   Wt 206 lb 6.4 oz (93.6 kg)   SpO2 100%   BMI 35.43 kg/m  Wt Readings from Last 3 Encounters:  09/11/20 206 lb 6.4 oz (93.6 kg)  07/31/20 199 lb 3.2 oz (90.4 kg)  05/07/20 197 lb 9.6 oz (89.6 kg)     Health Maintenance Due  Topic Date Due   HPV VACCINES (1 - 2-dose series) Never done   HIV Screening  Never done   Hepatitis C Screening  Never done   TETANUS/TDAP  Never done   COVID-19 Vaccine (3 - Booster for Moderna series) 10/20/2019   PAP-Cervical Cytology Screening  Never done   PAP SMEAR-Modifier  Never done       Topic Date Due   HPV VACCINES (1 - 2-dose series) Never done    Lab Results  Component Value Date   TSH 0.009 (L) 07/31/2020   Lab Results  Component Value Date   WBC 6.2 07/31/2020   HGB 13.6 07/31/2020   HCT 41.7 07/31/2020   MCV 86 07/31/2020   PLT 263 07/31/2020   No results found for: NA, K, CHLORIDE, CO2, GLUCOSE, BUN, CREATININE, BILITOT, ALKPHOS, AST, ALT, PROT, ALBUMIN, CALCIUM, ANIONGAP, EGFR, GFR No results found for: CHOL No results found for: HDL No results found for: LDLCALC No results found for: TRIG No results found for: CHOLHDL No results found for: HGBA1C    Assessment & Plan:   Problem List Items Addressed This Visit     Hypothyroidism due to Hashimoto's thyroiditis - Primary    Will recheck labs today No alarm symptoms present today Do not feel recent weight gain and headaches are concerning for thyroid Leaving for [place] in September and will need an extended amount of medication to take with her.  Discussed the option of [facility] with the ability to purchase more than 3 months of medication at a time. She would prefer to take the years worth with her and I agree that this would be most reasonable considering the formulations of medication may be different in [place].   She will look into this and I will contact pharmacy to see if we can get extended amount of medication authorized by insurance through them, as well.        Relevant Orders   TSH   Other Visit Diagnoses     Screening for deficiency anemia  Relevant Orders   CBC with Differential       No orders of the defined types were placed in this encounter.   Follow-up: Return for When you get back!!!.    Orma Render, NP

## 2020-09-11 NOTE — Assessment & Plan Note (Signed)
Will recheck labs today No alarm symptoms present today Do not feel recent weight gain and headaches are concerning for thyroid Leaving for [place] in September and will need an extended amount of medication to take with her.  Discussed the option of [facility] with the ability to purchase more than 3 months of medication at a time. She would prefer to take the years worth with her and I agree that this would be most reasonable considering the formulations of medication may be different in [place].  She will look into this and I will contact pharmacy to see if we can get extended amount of medication authorized by insurance through them, as well.

## 2020-09-11 NOTE — Patient Instructions (Addendum)
We will let you know if we need change the dose on your synthroid.  I will call the pharmacy and see if we can get the prescription for at least 6 months.   Look up [facility] pharmacy and see if this looks like a good option for you. I may be able to write 4 separate 90 day prescriptions and have them send you the entire years worth of medication at once since it does not require it go through your insurance. It looks like some of them are only about $9 for a 90 days supply.   Be safe and have an amazing time!!

## 2020-09-12 LAB — CBC WITH DIFFERENTIAL/PLATELET
Basophils Absolute: 0.1 10*3/uL (ref 0.0–0.2)
Basos: 1 %
EOS (ABSOLUTE): 0.1 10*3/uL (ref 0.0–0.4)
Eos: 1 %
Hematocrit: 38.5 % (ref 34.0–46.6)
Hemoglobin: 13 g/dL (ref 11.1–15.9)
Immature Grans (Abs): 0 10*3/uL (ref 0.0–0.1)
Immature Granulocytes: 0 %
Lymphocytes Absolute: 2 10*3/uL (ref 0.7–3.1)
Lymphs: 22 %
MCH: 29.7 pg (ref 26.6–33.0)
MCHC: 33.8 g/dL (ref 31.5–35.7)
MCV: 88 fL (ref 79–97)
Monocytes Absolute: 0.7 10*3/uL (ref 0.1–0.9)
Monocytes: 8 %
Neutrophils Absolute: 6.3 10*3/uL (ref 1.4–7.0)
Neutrophils: 68 %
Platelets: 270 10*3/uL (ref 150–450)
RBC: 4.37 x10E6/uL (ref 3.77–5.28)
RDW: 13.3 % (ref 11.7–15.4)
WBC: 9.2 10*3/uL (ref 3.4–10.8)

## 2020-09-12 LAB — TSH: TSH: 0.13 u[IU]/mL — ABNORMAL LOW (ref 0.450–4.500)

## 2020-09-12 MED ORDER — LEVOTHYROXINE SODIUM 125 MCG PO TABS
125.0000 ug | ORAL_TABLET | Freq: Every day | ORAL | 0 refills | Status: DC
Start: 2020-09-12 — End: 2021-09-24

## 2020-09-12 NOTE — Addendum Note (Signed)
Addended by: Masey Scheiber, Huntley Dec E on: 09/12/2020 07:31 PM   Modules accepted: Orders

## 2020-09-12 NOTE — Progress Notes (Signed)
Please call:  Hemoglobin stable  Thyroid levels still too low.  Adjust dosing to  Mon-Wed-Fri-Sun: 1/2 tab (62.9mcg) levothyroxine Tue-Thur-Sat: 1 tab ( ) levothyroxine  Total weekly dose  Need to recheck TSH once more before leaving the country. Please call her and find out exactly when she leaves so I know when we need to have this done. I would like at least 1 week between her departure and the lab draw in case we need to make changes. Ideally, I would like to wait 4 weeks from the dose change, but if we need to test sooner, that is ok. Please let me know when she leaves so I can advise when I want her to have the test done.   Let her know I will send lab orders to [facility] at [address] in [place] for her to have these drawn so she doesn't have to come back to [place].   Thank you

## 2020-09-13 ENCOUNTER — Encounter (HOSPITAL_BASED_OUTPATIENT_CLINIC_OR_DEPARTMENT_OTHER): Payer: Self-pay | Admitting: Nurse Practitioner

## 2020-09-14 ENCOUNTER — Telehealth (HOSPITAL_BASED_OUTPATIENT_CLINIC_OR_DEPARTMENT_OTHER): Payer: Self-pay

## 2020-09-14 NOTE — Telephone Encounter (Signed)
Left message for patient to call back for results and recommendations. 

## 2020-09-14 NOTE — Telephone Encounter (Signed)
-----   Message from Tollie Eth, NP sent at 09/12/2020 10:08 AM EDT ----- Please call:  Hemoglobin stable  Thyroid levels still too low.  Adjust dosing to  Mon-Wed-Fri-Sun: 1/2 tab (62.60mcg) levothyroxine Tue-Thur-Sat: 1 tab ( ) levothyroxine  Total weekly dose  Need to recheck TSH once more before leaving the country. Please call her and find out exactly when she leaves so I know when we need to have this done. I would like at least 1 week between her departure and the lab draw in case we need to make changes. Ideally, I would like to wait 4 weeks from the dose change, but if we need to test sooner, that is ok. Please let me know when she leaves so I can advise when I want her to have the test done.    Let her know I will send lab orders to [facility] at [address] in [place] for her to have these drawn so she doesn't have to come back to [place].   Thank you

## 2020-09-17 ENCOUNTER — Telehealth (HOSPITAL_BASED_OUTPATIENT_CLINIC_OR_DEPARTMENT_OTHER): Payer: Self-pay

## 2020-09-17 NOTE — Telephone Encounter (Signed)
-----   Message from Sara E Early, NP sent at 09/12/2020 10:08 AM EDT ----- Please call:  Hemoglobin stable  Thyroid levels still too low.  Adjust dosing to  Mon-Wed-Fri-Sun: 1/2 tab (62.5mcg) levothyroxine Tue-Thur-Sat: 1 tab (125mcg) levothyroxine  Total weekly dose 625mcg  Need to recheck TSH once more before leaving the country. Please call her and find out exactly when she leaves so I know when we need to have this done. I would like at least 1 week between her departure and the lab draw in case we need to make changes. Ideally, I would like to wait 4 weeks from the dose change, but if we need to test sooner, that is ok. Please let me know when she leaves so I can advise when I want her to have the test done.    Let her know I will send lab orders to [facility] at [address] in [place] for her to have these drawn so she doesn't have to come back to [place].   Thank you  

## 2020-09-17 NOTE — Telephone Encounter (Signed)
Called patient to discus lab results. Patient is aware and agreeable to recommendations. Patient is agreeable to starting medication schedule. Instructed patient to contact the office with questions and concerns.

## 2020-09-17 NOTE — Addendum Note (Signed)
Addended by: Luiza Carranco, Huntley Dec E on: 09/17/2020 12:54 PM   Modules accepted: Orders

## 2020-09-27 ENCOUNTER — Encounter (HOSPITAL_BASED_OUTPATIENT_CLINIC_OR_DEPARTMENT_OTHER): Payer: Self-pay | Admitting: Nurse Practitioner

## 2020-09-28 ENCOUNTER — Telehealth (HOSPITAL_BASED_OUTPATIENT_CLINIC_OR_DEPARTMENT_OTHER): Payer: Self-pay

## 2020-09-28 ENCOUNTER — Telehealth (HOSPITAL_BASED_OUTPATIENT_CLINIC_OR_DEPARTMENT_OTHER): Payer: Self-pay | Admitting: Nurse Practitioner

## 2020-09-28 NOTE — Telephone Encounter (Signed)
Contacted [facility] in [place] regarding Levothyroxine for 12 months while the patient will be out of the county.  Spoke with Raynelle Fanning.  She reported the patient will need to go on the website of her insurance under plans an benefits, complete the form and e-mail it  Called the patient and reported this information to her.

## 2020-09-28 NOTE — Telephone Encounter (Signed)
Please call pharmacy in [place] to determine if they received the order for levothyroxine for 12 months and if they need additional documentation in order to get this approved by insurance. Patient will be leaving for one year trip abroad in less than one month.

## 2020-09-28 NOTE — Telephone Encounter (Signed)
error 

## 2020-10-15 ENCOUNTER — Telehealth (HOSPITAL_BASED_OUTPATIENT_CLINIC_OR_DEPARTMENT_OTHER): Payer: Self-pay

## 2020-10-15 NOTE — Telephone Encounter (Signed)
Patient called stating she needed a requisition sent to [facility] in [place], [place] for her TSH levels to be drawn.  Faxed requisition and spoke to Terri at [place].  Left message for stating this information.

## 2021-04-25 ENCOUNTER — Ambulatory Visit (INDEPENDENT_AMBULATORY_CARE_PROVIDER_SITE_OTHER): Payer: Self-pay | Admitting: Nurse Practitioner

## 2021-04-25 ENCOUNTER — Encounter (HOSPITAL_BASED_OUTPATIENT_CLINIC_OR_DEPARTMENT_OTHER): Payer: Self-pay | Admitting: Nurse Practitioner

## 2021-04-25 ENCOUNTER — Encounter (HOSPITAL_BASED_OUTPATIENT_CLINIC_OR_DEPARTMENT_OTHER): Payer: Self-pay

## 2021-04-25 NOTE — Progress Notes (Signed)
Patient not seen.

## 2021-05-02 ENCOUNTER — Encounter (HOSPITAL_BASED_OUTPATIENT_CLINIC_OR_DEPARTMENT_OTHER): Payer: Self-pay | Admitting: Nurse Practitioner

## 2021-09-24 ENCOUNTER — Telehealth (HOSPITAL_BASED_OUTPATIENT_CLINIC_OR_DEPARTMENT_OTHER): Payer: Self-pay

## 2021-09-24 DIAGNOSIS — E038 Other specified hypothyroidism: Secondary | ICD-10-CM

## 2021-09-24 MED ORDER — LEVOTHYROXINE SODIUM 125 MCG PO TABS: 125.0000 ug | ORAL_TABLET | Freq: Every day | ORAL | 0 refills | Status: DC

## 2021-09-24 NOTE — Telephone Encounter (Signed)
I have sent in a 3-month supply of the medication but we will need to see her in the next 3 months to get updated labs and see how she is doing.  I sent the medication to the pharmacy in [place] but if she needs me to change to the [place] that is okay.  Thank you

## 2021-09-24 NOTE — Telephone Encounter (Signed)
Patient left voicemail seeking advice.   Patient is low on thyroid medication and requires refill. Uncertain if she can request refill or if she requires an appt. Patient has 1 week left of medication.  Last seen on 09/11/2020.   Will wait for provider recommendation.

## 2021-11-11 ENCOUNTER — Ambulatory Visit (HOSPITAL_BASED_OUTPATIENT_CLINIC_OR_DEPARTMENT_OTHER): Payer: BC Managed Care – PPO | Admitting: Nurse Practitioner

## 2021-11-18 ENCOUNTER — Ambulatory Visit (HOSPITAL_BASED_OUTPATIENT_CLINIC_OR_DEPARTMENT_OTHER): Payer: BC Managed Care – PPO | Admitting: Nurse Practitioner

## 2022-02-08 ENCOUNTER — Other Ambulatory Visit (HOSPITAL_BASED_OUTPATIENT_CLINIC_OR_DEPARTMENT_OTHER): Payer: Self-pay | Admitting: Nurse Practitioner

## 2022-02-08 DIAGNOSIS — E038 Other specified hypothyroidism: Secondary | ICD-10-CM

## 2022-02-11 ENCOUNTER — Encounter: Payer: Self-pay | Admitting: Internal Medicine

## 2022-03-17 ENCOUNTER — Ambulatory Visit: Payer: BC Managed Care – PPO | Admitting: Family Medicine

## 2022-03-17 ENCOUNTER — Encounter: Payer: Self-pay | Admitting: Family Medicine

## 2022-03-17 VITALS — BP 120/80 | HR 91 | Temp 98.6°F | Ht 64.0 in | Wt 202.0 lb

## 2022-03-17 DIAGNOSIS — Z13220 Encounter for screening for lipoid disorders: Secondary | ICD-10-CM | POA: Diagnosis not present

## 2022-03-17 DIAGNOSIS — E063 Autoimmune thyroiditis: Secondary | ICD-10-CM

## 2022-03-17 DIAGNOSIS — Z136 Encounter for screening for cardiovascular disorders: Secondary | ICD-10-CM | POA: Diagnosis not present

## 2022-03-17 DIAGNOSIS — R7302 Impaired glucose tolerance (oral): Secondary | ICD-10-CM | POA: Diagnosis not present

## 2022-03-17 DIAGNOSIS — Z0000 Encounter for general adult medical examination without abnormal findings: Secondary | ICD-10-CM

## 2022-03-17 DIAGNOSIS — Z1159 Encounter for screening for other viral diseases: Secondary | ICD-10-CM | POA: Diagnosis not present

## 2022-03-17 DIAGNOSIS — Z124 Encounter for screening for malignant neoplasm of cervix: Secondary | ICD-10-CM | POA: Diagnosis not present

## 2022-03-17 DIAGNOSIS — E038 Other specified hypothyroidism: Secondary | ICD-10-CM | POA: Diagnosis not present

## 2022-03-17 DIAGNOSIS — Z7689 Persons encountering health services in other specified circumstances: Secondary | ICD-10-CM

## 2022-03-17 NOTE — Progress Notes (Addendum)
Complete physical exam and establish care  Patient: Kristie Bean   DOB: 23-Jun-2000   22 y.o. Female  MRN: [id number]  Subjective:    Chief Complaint  Patient presents with   Establish Care   Annual Exam    Kristie Bean is a 21 y.o. female who presents today for a complete physical exam. She reports consuming a general diet. The patient does not participate in regular exercise at present. She generally feels well. She reports sleeping well. She does not have additional problems to discuss today.  Pt would like to think about HPV and tdap vaccines. Pt says she was diagnosed with Hashimoto's in 2022. She is on Synthroid 125 mcg on Tuesday, Thursday, and Saturday. All the rest of the days, she takes 1/2 tab. She's been doing this since 2022 before she left to study abroad in [place].  She has never had pap smear. No family hx of ovarian or cervical cancer. LMP 03/09/2022 and cycle 37 days  Most recent fall risk assessment:    03/17/2022    3:01 PM  Fall Risk   Falls in the past year? 0  Number falls in past yr: 0  Injury with Fall? 0  Risk for fall due to : No Fall Risks  Follow up Falls evaluation completed     Most recent depression screenings:    03/17/2022    3:01 PM 05/07/2020   10:55 AM  PHQ 2/9 Scores  PHQ - 2 Score 1 0  PHQ- 9 Score 4 3    Vision:Within last year and Dental: No current dental problems  Patient Active Problem List   Diagnosis Date Noted   Iron deficiency anemia 07/31/2020   Hypothyroidism due to Hashimoto's thyroiditis 05/07/2020   Body mass index (BMI) of 33.0-33.9 in adult 04/27/2020   Vitamin D insufficiency 07/21/2011   Past Medical History:  Diagnosis Date   Encounter for school health examination 04/27/2020   Encounter to establish care 04/27/2020   Thyroid disease    Travel advice encounter 04/27/2020   Past Surgical History:  Procedure Laterality Date   WISDOM TOOTH EXTRACTION        Patient Care Team: Leeanne Rio, MD as  PCP - General (Family Medicine)   Outpatient Medications Prior to Visit  Medication Sig   levothyroxine (SYNTHROID) 125 MCG tablet TAKE 1 TABLET(125 MCG) BY MOUTH DAILY   No facility-administered medications prior to visit.    Review of Systems  All other systems reviewed and are negative.         Objective:     BP 120/80   Pulse 91   Temp 98.6 F (37 C) (Oral)   Ht 5' 4"$  (1.626 m)   Wt 202 lb (91.6 kg)   LMP 03/09/2022 (Exact Date)   SpO2 98%   BMI 34.67 kg/m     Physical Exam Vitals and nursing note reviewed. Exam conducted with a chaperone present.  Constitutional:      Appearance: Normal appearance. She is obese.  HENT:     Head: Normocephalic and atraumatic.     Right Ear: Tympanic membrane, ear canal and external ear normal.     Left Ear: Tympanic membrane, ear canal and external ear normal.     Nose: Nose normal.     Mouth/Throat:     Mouth: Mucous membranes are moist.     Pharynx: Oropharynx is clear.  Eyes:     Conjunctiva/sclera: Conjunctivae normal.     Pupils: Pupils are equal,  round, and reactive to light.  Cardiovascular:     Rate and Rhythm: Normal rate and regular rhythm.     Pulses: Normal pulses.     Heart sounds: Normal heart sounds.  Pulmonary:     Effort: Pulmonary effort is normal.     Breath sounds: Normal breath sounds.  Abdominal:     General: Abdomen is flat. Bowel sounds are normal.  Genitourinary:    General: Normal vulva.  Musculoskeletal:        General: Normal range of motion.  Skin:    General: Skin is warm.     Capillary Refill: Capillary refill takes less than 2 seconds.  Neurological:     General: No focal deficit present.     Mental Status: She is alert and oriented to person, place, and time. Mental status is at baseline.  Psychiatric:        Mood and Affect: Mood normal.        Behavior: Behavior normal.        Thought Content: Thought content normal.        Judgment: Judgment normal.      No results found  for any visits on 03/17/22.      Assessment & Plan:    Routine Health Maintenance and Physical Exam  Immunization History  Administered Date(s) Administered   Meningococcal Conjugate 07/15/2011   Moderna Sars-Covid-2 Vaccination 04/22/2019, 05/20/2019    Health Maintenance  Topic Date Due   HPV VACCINES (1 - 2-dose series) Never done   HIV Screening  Never done   Hepatitis C Screening  Never done   DTaP/Tdap/Td (1 - Tdap) Never done   PAP-Cervical Cytology Screening  Never done   PAP SMEAR-Modifier  Never done   INFLUENZA VACCINE  05/04/2022 (Originally 09/03/2021)   COVID-19 Vaccine  Discontinued    Discussed health benefits of physical activity, and encouraged her to engage in regular exercise appropriate for her age and condition.  Problem List Items Addressed This Visit       Endocrine   Hypothyroidism due to Hashimoto's thyroiditis   Relevant Orders   TSH   T4, free   Other Visit Diagnoses     Annual physical exam    -  Primary   Encounter to establish care with new doctor       Impaired glucose tolerance       Relevant Orders   CBC with Differential/Platelet   Comprehensive metabolic panel   Hemoglobin A1c   Encounter for lipid screening for cardiovascular disease       Relevant Orders   Lipid panel   Need for hepatitis C screening test       Relevant Orders   Hepatitis C antibody   Screening for cervical cancer       Relevant Orders   Pap LB (liquid-based)   Screening for viral disease       Relevant Orders   HIV Antibody (routine testing w rflx)      Return in about 6 months (around 09/15/2022) for Hypothyroidism. Screening labs including thyroid monitoring Pap smear today Defers Tdap and HPV vaccines for now See in 6 months for follow up of Hypothyroidism sooner pending labs    Leeanne Rio, MD

## 2022-03-18 LAB — COMPREHENSIVE METABOLIC PANEL
ALT: 15 IU/L (ref 0–32)
AST: 16 IU/L (ref 0–40)
Albumin/Globulin Ratio: 1.5 (ref 1.2–2.2)
Albumin: 4.8 g/dL (ref 4.0–5.0)
Alkaline Phosphatase: 74 IU/L (ref 44–121)
BUN/Creatinine Ratio: 14 (ref 9–23)
BUN: 10 mg/dL (ref 6–20)
Bilirubin Total: 0.3 mg/dL (ref 0.0–1.2)
CO2: 19 mmol/L — ABNORMAL LOW (ref 20–29)
Calcium: 9.6 mg/dL (ref 8.7–10.2)
Chloride: 102 mmol/L (ref 96–106)
Creatinine, Ser: 0.7 mg/dL (ref 0.57–1.00)
Globulin, Total: 3.2 g/dL (ref 1.5–4.5)
Glucose: 98 mg/dL (ref 70–99)
Potassium: 4 mmol/L (ref 3.5–5.2)
Sodium: 137 mmol/L (ref 134–144)
Total Protein: 8 g/dL (ref 6.0–8.5)
eGFR: 125 mL/min/{1.73_m2} (ref >59)

## 2022-03-18 LAB — HEMOGLOBIN A1C
Est. average glucose Bld gHb Est-mCnc: 97 mg/dL
Hgb A1c MFr Bld: 5 % (ref 4.8–5.6)

## 2022-03-18 LAB — CBC WITH DIFFERENTIAL/PLATELET
Basophils Absolute: 0 10*3/uL (ref 0.0–0.2)
Basos: 1 %
EOS (ABSOLUTE): 0.1 10*3/uL (ref 0.0–0.4)
Eos: 1 %
Hematocrit: 40.9 % (ref 34.0–46.6)
Hemoglobin: 13.7 g/dL (ref 11.1–15.9)
Immature Grans (Abs): 0 10*3/uL (ref 0.0–0.1)
Immature Granulocytes: 0 %
Lymphocytes Absolute: 1.8 10*3/uL (ref 0.7–3.1)
Lymphs: 27 %
MCH: 29.6 pg (ref 26.6–33.0)
MCHC: 33.5 g/dL (ref 31.5–35.7)
MCV: 88 fL (ref 79–97)
Monocytes Absolute: 0.5 10*3/uL (ref 0.1–0.9)
Monocytes: 8 %
Neutrophils Absolute: 4.1 10*3/uL (ref 1.4–7.0)
Neutrophils: 63 %
Platelets: 306 10*3/uL (ref 150–450)
RBC: 4.63 x10E6/uL (ref 3.77–5.28)
RDW: 12.4 % (ref 11.7–15.4)
WBC: 6.5 10*3/uL (ref 3.4–10.8)

## 2022-03-18 LAB — LIPID PANEL
Chol/HDL Ratio: 3.2 ratio (ref 0.0–4.4)
Cholesterol, Total: 163 mg/dL (ref 100–199)
HDL: 51 mg/dL (ref >39)
LDL Chol Calc (NIH): 89 mg/dL (ref 0–99)
Triglycerides: 130 mg/dL (ref 0–149)
VLDL Cholesterol Cal: 23 mg/dL (ref 5–40)

## 2022-03-18 LAB — TSH: TSH: 3.64 u[IU]/mL (ref 0.450–4.500)

## 2022-03-18 LAB — HEPATITIS C ANTIBODY: Hep C Virus Ab: NONREACTIVE

## 2022-03-18 LAB — HIV ANTIBODY (ROUTINE TESTING W REFLEX): HIV Screen 4th Generation wRfx: NONREACTIVE

## 2022-03-18 LAB — T4, FREE: Free T4: 1.56 ng/dL (ref 0.82–1.77)

## 2022-03-20 LAB — PAP LB (LIQUID-BASED): PAP Smear Comment: 0

## 2022-04-16 ENCOUNTER — Ambulatory Visit: Payer: BC Managed Care – PPO | Admitting: Nurse Practitioner

## 2022-04-16 ENCOUNTER — Encounter: Payer: Self-pay | Admitting: Nurse Practitioner

## 2022-04-16 VITALS — BP 120/72 | HR 103 | Wt 202.4 lb

## 2022-04-16 DIAGNOSIS — N943 Premenstrual tension syndrome: Secondary | ICD-10-CM

## 2022-04-16 DIAGNOSIS — Z6834 Body mass index (BMI) 34.0-34.9, adult: Secondary | ICD-10-CM

## 2022-04-16 DIAGNOSIS — E038 Other specified hypothyroidism: Secondary | ICD-10-CM | POA: Diagnosis not present

## 2022-04-16 DIAGNOSIS — E063 Autoimmune thyroiditis: Secondary | ICD-10-CM

## 2022-04-16 DIAGNOSIS — H9313 Tinnitus, bilateral: Secondary | ICD-10-CM

## 2022-04-16 DIAGNOSIS — R21 Rash and other nonspecific skin eruption: Secondary | ICD-10-CM

## 2022-04-16 MED ORDER — LEVOTHYROXINE SODIUM 125 MCG PO TABS: ORAL_TABLET | ORAL | 3 refills | Status: DC

## 2022-04-16 NOTE — Patient Instructions (Addendum)
Let's change the Levothyroxine '125mg'$   Tuesday, Thursday, Saturday, Sunday and 62.'5mg'$  (1/2 tab) Monday, Wednesday, Friday.   I will send in more to the [facility] so you have this on hand.  I am going to look and see what testing we need to do to see if we can get to the bottom of the symptoms you are having.

## 2022-04-16 NOTE — Progress Notes (Signed)
Kristie Keeler, DNP, AGNP-c [facility]  [address] [place], [place] [address] [phone]  ESTABLISHED PATIENT- Chronic Health and/or Follow-Up Visit  Blood pressure 120/72, pulse (!) 103, weight 202 lb 6.4 oz (91.8 kg), last menstrual period 03/09/2022.    Kristie Bean is a 22 y.o. year old female presenting today for evaluation and management of the following:  Kristie Bean presents today with concerns regarding her thyroid levels, weight management, tinnitus, premenstrual symptoms, and a recurrent rash on her hands.  She reports having her thyroid levels checked approximately a month ago, revealing a TSH level of 3.6. Her medical history includes Hashimoto's thyroiditis, for which she is currently taking Levothyroxine. She was initially diagnosed in 2022 shortly before leaving for a yearlong study abroad program in [place]. She did have intermittent monitoring of her levels while in [place] and they were reportedly stable. Her levothyroxine dose has not been changed since August of 2022.  Despite tracking calories and exercising regularly, Kristie Bean has been experiencing difficulty with weight management. She notes a weight loss of about 10 pounds since her last visit but has not observed any further changes despite very strict monitoring. She is very frustrated by the lack of change noted.   Kristie Bean also completed a [employer] test, which identified a celiac variant. She has been dealing with a recurrent, non-itchy, blistery rash on her hands that sometimes peels, primarily during colder months. She is uncertain if this condition is related to her Hashimoto's thyroiditis or a potential gluten intolerance. The patient is contemplating a gluten-free diet to assess if it alleviates her symptoms.  Additionally, the patient mentions worsening tinnitus and severe premenstrual symptoms, characterized by emotional changes and depression.  Regarding her personal life, the patient is in  her final semester of school and is facing a decision between moving to [place] to share an apartment with friends or returning home to save money for a potential master's degree in international relations. She is considering a career in [employer] or [place] translation work.  All ROS negative with exception of what is listed above.   PHYSICAL EXAM Physical Exam Vitals and nursing note reviewed.  Constitutional:      Appearance: Normal appearance.  HENT:     Head: Normocephalic and atraumatic.     Right Ear: Tympanic membrane, ear canal and external ear normal. There is no impacted cerumen.     Left Ear: Tympanic membrane, ear canal and external ear normal. There is no impacted cerumen.     Nose: Nose normal.     Mouth/Throat:     Mouth: Mucous membranes are moist.     Pharynx: Oropharynx is clear.  Eyes:     Extraocular Movements: Extraocular movements intact.     Pupils: Pupils are equal, round, and reactive to light.  Neck:     Thyroid: Thyromegaly present.     Vascular: No carotid bruit.  Cardiovascular:     Rate and Rhythm: Normal rate and regular rhythm.     Pulses: Normal pulses.     Heart sounds: Normal heart sounds.  Pulmonary:     Effort: Pulmonary effort is normal.     Breath sounds: Normal breath sounds.  Abdominal:     General: Bowel sounds are normal.     Palpations: Abdomen is soft.  Musculoskeletal:        General: Normal range of motion.     Cervical back: Normal range of motion. No tenderness.     Right lower leg: No edema.  Left lower leg: No edema.  Lymphadenopathy:     Cervical: No cervical adenopathy.  Skin:    General: Skin is warm and dry.     Capillary Refill: Capillary refill takes less than 2 seconds.  Neurological:     General: No focal deficit present.     Mental Status: She is alert and oriented to person, place, and time.  Psychiatric:        Mood and Affect: Mood normal.     PLAN Problem List Items Addressed This Visit     BMI  34.0-34.9,adult - Primary    Kristie Bean is experiencing significant weight concerns with increasing BMI despite daily exercise and strict dietary monitoring. At this time, I do feel that some of her symptoms could be related to elevation in her TSH. Although her recent labs were normal, they are in the higher range.  Plan: - Increase levothyroxine dosing slowly, she has been particularly sensitive in the past.  - Monitor for lab results and changes.       Hypothyroidism due to Hashimoto's thyroiditis    Kristie Bean's TSH level is currently at 3.6, within the normal range but on the higher end for Hashimoto's thyroiditis. She is on a Levothyroxine regimen with alternating daily dosages and report feeling better, though there's consideration for dosage adjustment to further improve symptoms such as mood changes and weight management issues. Given that she has seen a change in some of her symptoms, I feel that this would be the most reasonable initial intervention to determine if thyroid etiology is the cause. Given the complexity of the patient's symptoms, including unresolved weight management issues, mood changes, and potential for additional autoimmune conditions, a specialized evaluation may be beneficial. Plan: - Adjust Levothyroxine dosage to include one additional full tablet per week (Sunday, Tuesday, Thursday, Saturday full tablet; Monday, Wednesday, Friday half tablet). - Synthroid refill ordered. - Will send referral to endocrinology for complete evaluation.      Relevant Medications   levothyroxine (SYNTHROID) 125 MCG tablet   Other Relevant Orders   Ambulatory referral to Endocrinology   Tinnitus of both ears    Bilateral ringing ongoing for some time with no known etiology. At this time no alarm symptoms are present. Discussion of possible evaluation with ENT.  Plan: - Let me know if you would like ENT referral and I will be happy to place this - Monitor for new or worsening symptoms and  report immediately.       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 She also has Hashimoto's thyroiditis, increasing the risk for other autoimmune diseases. Plan: - Consider a gluten-free diet trial to assess symptom improvement. - Advise sending a picture of the rash via the patient portal for further evaluation. - Consider further testing, including a labs and colonoscopy and biopsy, if blood tests are positive for celiac disease. - Please let me know if you would like these tests completed.       PMS (premenstrual syndrome)    The patient reports severe PMS symptoms, including emotional changes and depression, significantly impacting their quality of life. Plan: - Discuss PMS management options, including birth control for hormone stabilization or BuSpar for quick-acting mood benefits. - Provide medication information for patient review.       Return for 8 week thyroid labs- 3 month f/u.  Kristie Keeler, DNP, AGNP-c 04/16/2022 10:18 AM

## 2022-04-21 ENCOUNTER — Encounter: Payer: Self-pay | Admitting: Nurse Practitioner

## 2022-04-21 DIAGNOSIS — N943 Premenstrual tension syndrome: Secondary | ICD-10-CM | POA: Insufficient documentation

## 2022-04-21 DIAGNOSIS — H9313 Tinnitus, bilateral: Secondary | ICD-10-CM | POA: Insufficient documentation

## 2022-04-21 DIAGNOSIS — R21 Rash and other nonspecific skin eruption: Secondary | ICD-10-CM | POA: Insufficient documentation

## 2022-04-21 NOTE — Assessment & Plan Note (Signed)
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 She also has Hashimoto's thyroiditis, increasing the risk for other autoimmune diseases. Plan: - Consider a gluten-free diet trial to assess symptom improvement. - Advise sending a picture of the rash via the patient portal for further evaluation. - Consider further testing, including a labs and colonoscopy and biopsy, if blood tests are positive for celiac disease. - Please let me know if you would like these tests completed.

## 2022-04-21 NOTE — Assessment & Plan Note (Signed)
The patient reports severe PMS symptoms, including emotional changes and depression, significantly impacting their quality of life. Plan: - Discuss PMS management options, including birth control for hormone stabilization or BuSpar for quick-acting mood benefits. - Provide medication information for patient review.

## 2022-04-21 NOTE — Assessment & Plan Note (Addendum)
Kristie Bean's TSH level is currently at 3.6, within the normal range but on the higher end for Hashimoto's thyroiditis. She is on a Levothyroxine regimen with alternating daily dosages and report feeling better, though there's consideration for dosage adjustment to further improve symptoms such as mood changes and weight management issues. Given that she has seen a change in some of her symptoms, I feel that this would be the most reasonable initial intervention to determine if thyroid etiology is the cause. Given the complexity of the patient's symptoms, including unresolved weight management issues, mood changes, and potential for additional autoimmune conditions, a specialized evaluation may be beneficial. Plan: - Adjust Levothyroxine dosage to include one additional full tablet per week (Sunday, Tuesday, Thursday, Saturday full tablet; Monday, Wednesday, Friday half tablet). - Synthroid refill ordered. - Will send referral to endocrinology for complete evaluation.

## 2022-04-21 NOTE — Assessment & Plan Note (Signed)
Bilateral ringing ongoing for some time with no known etiology. At this time no alarm symptoms are present. Discussion of possible evaluation with ENT.  Plan: - Let me know if you would like ENT referral and I will be happy to place this - Monitor for new or worsening symptoms and report immediately.

## 2022-04-21 NOTE — Assessment & Plan Note (Signed)
Madasin is experiencing significant weight concerns with increasing BMI despite daily exercise and strict dietary monitoring. At this time, I do feel that some of her symptoms could be related to elevation in her TSH. Although her recent labs were normal, they are in the higher range.  Plan: - Increase levothyroxine dosing slowly, she has been particularly sensitive in the past.  - Monitor for lab results and changes.

## 2022-04-28 ENCOUNTER — Encounter: Payer: Self-pay | Admitting: Nurse Practitioner

## 2022-05-20 DIAGNOSIS — R140 Abdominal distension (gaseous): Secondary | ICD-10-CM | POA: Diagnosis not present

## 2022-05-20 DIAGNOSIS — E063 Autoimmune thyroiditis: Secondary | ICD-10-CM | POA: Diagnosis not present

## 2022-05-20 DIAGNOSIS — E039 Hypothyroidism, unspecified: Secondary | ICD-10-CM | POA: Diagnosis not present

## 2022-06-22 ENCOUNTER — Encounter: Payer: Self-pay | Admitting: Nurse Practitioner

## 2022-07-01 DIAGNOSIS — E039 Hypothyroidism, unspecified: Secondary | ICD-10-CM | POA: Diagnosis not present

## 2022-07-01 DIAGNOSIS — R140 Abdominal distension (gaseous): Secondary | ICD-10-CM | POA: Diagnosis not present

## 2022-07-08 ENCOUNTER — Encounter: Payer: Self-pay | Admitting: Nurse Practitioner

## 2022-07-08 ENCOUNTER — Ambulatory Visit: Payer: BC Managed Care – PPO | Admitting: Nurse Practitioner

## 2022-07-08 VITALS — BP 124/84 | HR 90 | Ht 64.0 in | Wt 198.2 lb

## 2022-07-08 DIAGNOSIS — Z6834 Body mass index (BMI) 34.0-34.9, adult: Secondary | ICD-10-CM | POA: Diagnosis not present

## 2022-07-08 DIAGNOSIS — E038 Other specified hypothyroidism: Secondary | ICD-10-CM

## 2022-07-08 DIAGNOSIS — F4001 Agoraphobia with panic disorder: Secondary | ICD-10-CM

## 2022-07-08 DIAGNOSIS — E063 Autoimmune thyroiditis: Secondary | ICD-10-CM | POA: Diagnosis not present

## 2022-07-08 MED ORDER — PROPRANOLOL HCL 10 MG PO TABS: 10.0000 mg | ORAL_TABLET | Freq: Three times a day (TID) | ORAL | 3 refills | Status: AC | PRN

## 2022-07-08 NOTE — Patient Instructions (Signed)
Drop back down to the average per day and see how you feel on that.  You can always drop down even more to 75 average to see how you feel on that dose.

## 2022-07-08 NOTE — Progress Notes (Unsigned)
  Tollie Eth, DNP, AGNP-c [facility] [address] [place], [place] [address] [phone]  Subjective:   Kristie Bean is a 22 y.o. female .  Kristie Bean presents today with concerns related to her thyroid medication and associated symptoms.  Thyroid Medication Concerns Kristie Bean reports experiencing anxiety, stomach issues, and shaking hands since the increase in levothyroxine dosage from 88 to 100 mcg by Dr. Talmage Nap. She was previously on a different dosage of levothyroxine and was advised not to cut the pills. She still has a bottle of the 125 mcg dosage. Kristie Bean inquires about the possibility of going back to the previous dosage or trying something else.  Anxiety Management Kristie Bean has not previously taken propranolol but is open to trying it for anxiety and heart rate management. She expresses concern about her anxiety symptoms potentially manifesting into depressive symptoms if not addressed. Kristie Bean mother has also noticed a change in Kristie Bean's behavior.  Weight Management Kristie Bean mentions a history of weight issues despite efforts to manage it through diet and exercise. She has explored the possibility of celiac disease, but recent lab results show that she does not have it. Kristie Bean wonders if her weight issues could be related to cortisol levels or other metabolic factors. She expresses a desire to address her anxiety and thyroid issues before focusing on weight management.  Thyroid Levels and Care Kristie Bean's most recent TSH level was 2.350. She has concerns with the recent adjustment of her levothyroxine dosage by another provider due to symptoms she has been experiencing and is open to discussing dosage changes.    PMH, Medications, and Allergies reviewed and updated in chart as appropriate.   ROS negative except for what is listed in HPI. Objective:  BP 124/84   Pulse 90   Wt 198 lb 3.2 oz (89.9 kg)   BMI 34.02 kg/m  Physical Exam Vitals and nursing note  reviewed.  Constitutional:      Appearance: Normal appearance.  HENT:     Head: Normocephalic.  Eyes:     Pupils: Pupils are equal, round, and reactive to light.  Cardiovascular:     Rate and Rhythm: Normal rate and regular rhythm.     Pulses: Normal pulses.     Heart sounds: Normal heart sounds.  Pulmonary:     Effort: Pulmonary effort is normal.     Breath sounds: Normal breath sounds.  Musculoskeletal:        General: Normal range of motion.     Cervical back: Normal range of motion. No tenderness.  Skin:    General: Skin is warm.  Neurological:     General: No focal deficit present.     Mental Status: She is alert and oriented to person, place, and time.  Psychiatric:        Mood and Affect: Mood normal.          Assessment & Plan:   Problem List Items Addressed This Visit     Hypothyroidism due to Hashimoto's thyroiditis - Primary   Relevant Medications   levothyroxine (SYNTHROID) 100 MCG tablet   Other Relevant Orders   TSH   T4, free      Tollie Eth, DNP, AGNP-c 07/08/2022  4:33 PM    History, Medications, Surgery, SDOH, and Family History reviewed and updated as appropriate.

## 2022-08-31 DIAGNOSIS — F4001 Agoraphobia with panic disorder: Secondary | ICD-10-CM | POA: Insufficient documentation

## 2022-08-31 HISTORY — DX: Agoraphobia with panic disorder: F40.01

## 2022-08-31 NOTE — Assessment & Plan Note (Signed)
Reported increased anxiety, tremors, and stomach issues since levothyroxine dosage was increased from 88 mcg to 100 mcg by Dr. Marissa Calamity (endocrinologist).  Her recent TSH level was 2.350.  Plan: - Decrease levothyroxine dosage back to 88 mcg and monitor for symptom improvement. - Continue monitoring TSH levels and adjust levothyroxine dosage based on symptoms and laboratory values. - Reevaluate in 6 to 8 weeks and consider further adjust meds to dosage as needed.

## 2022-08-31 NOTE — Assessment & Plan Note (Signed)
Panic and increased anxiety symptoms are present.  Unclear if these are specifically related to thyroid or other underlying issues.  She has not tried propranolol before but is open to trying this. Plan: - Will prescribe propranolol 10 mg up to 3 times a day as needed for anxiety and palpitations. - Monitor Kristie Bean's response to the medication and adjust dosage as necessary.  She is aware this is a starting dose and further adjustments may be needed for optimal control.

## 2022-08-31 NOTE — Assessment & Plan Note (Signed)
Kristie Bean reports difficulty with weight management despite efforts in diet and exercise.  At this time it appears that her thyroid levels are not well-controlled and this could be contributing.  We discussed getting the thyroid under control and then addressing weight management. Plan: - Continue with diet and exercise and close monitoring. - If no changes with improvement of thyroid control we can consider alternatives.

## 2022-09-09 ENCOUNTER — Encounter: Payer: Self-pay | Admitting: Nurse Practitioner

## 2022-10-13 ENCOUNTER — Encounter: Payer: Self-pay | Admitting: Nurse Practitioner

## 2022-10-18 IMAGING — DX DG CHEST 2V
2 series · 2 of 2 positions shown · non-contrast
Comparison: None.

CLINICAL DATA: Travel clearance for study abroad

EXAM:
CHEST - 2 VIEW

[chest pa]
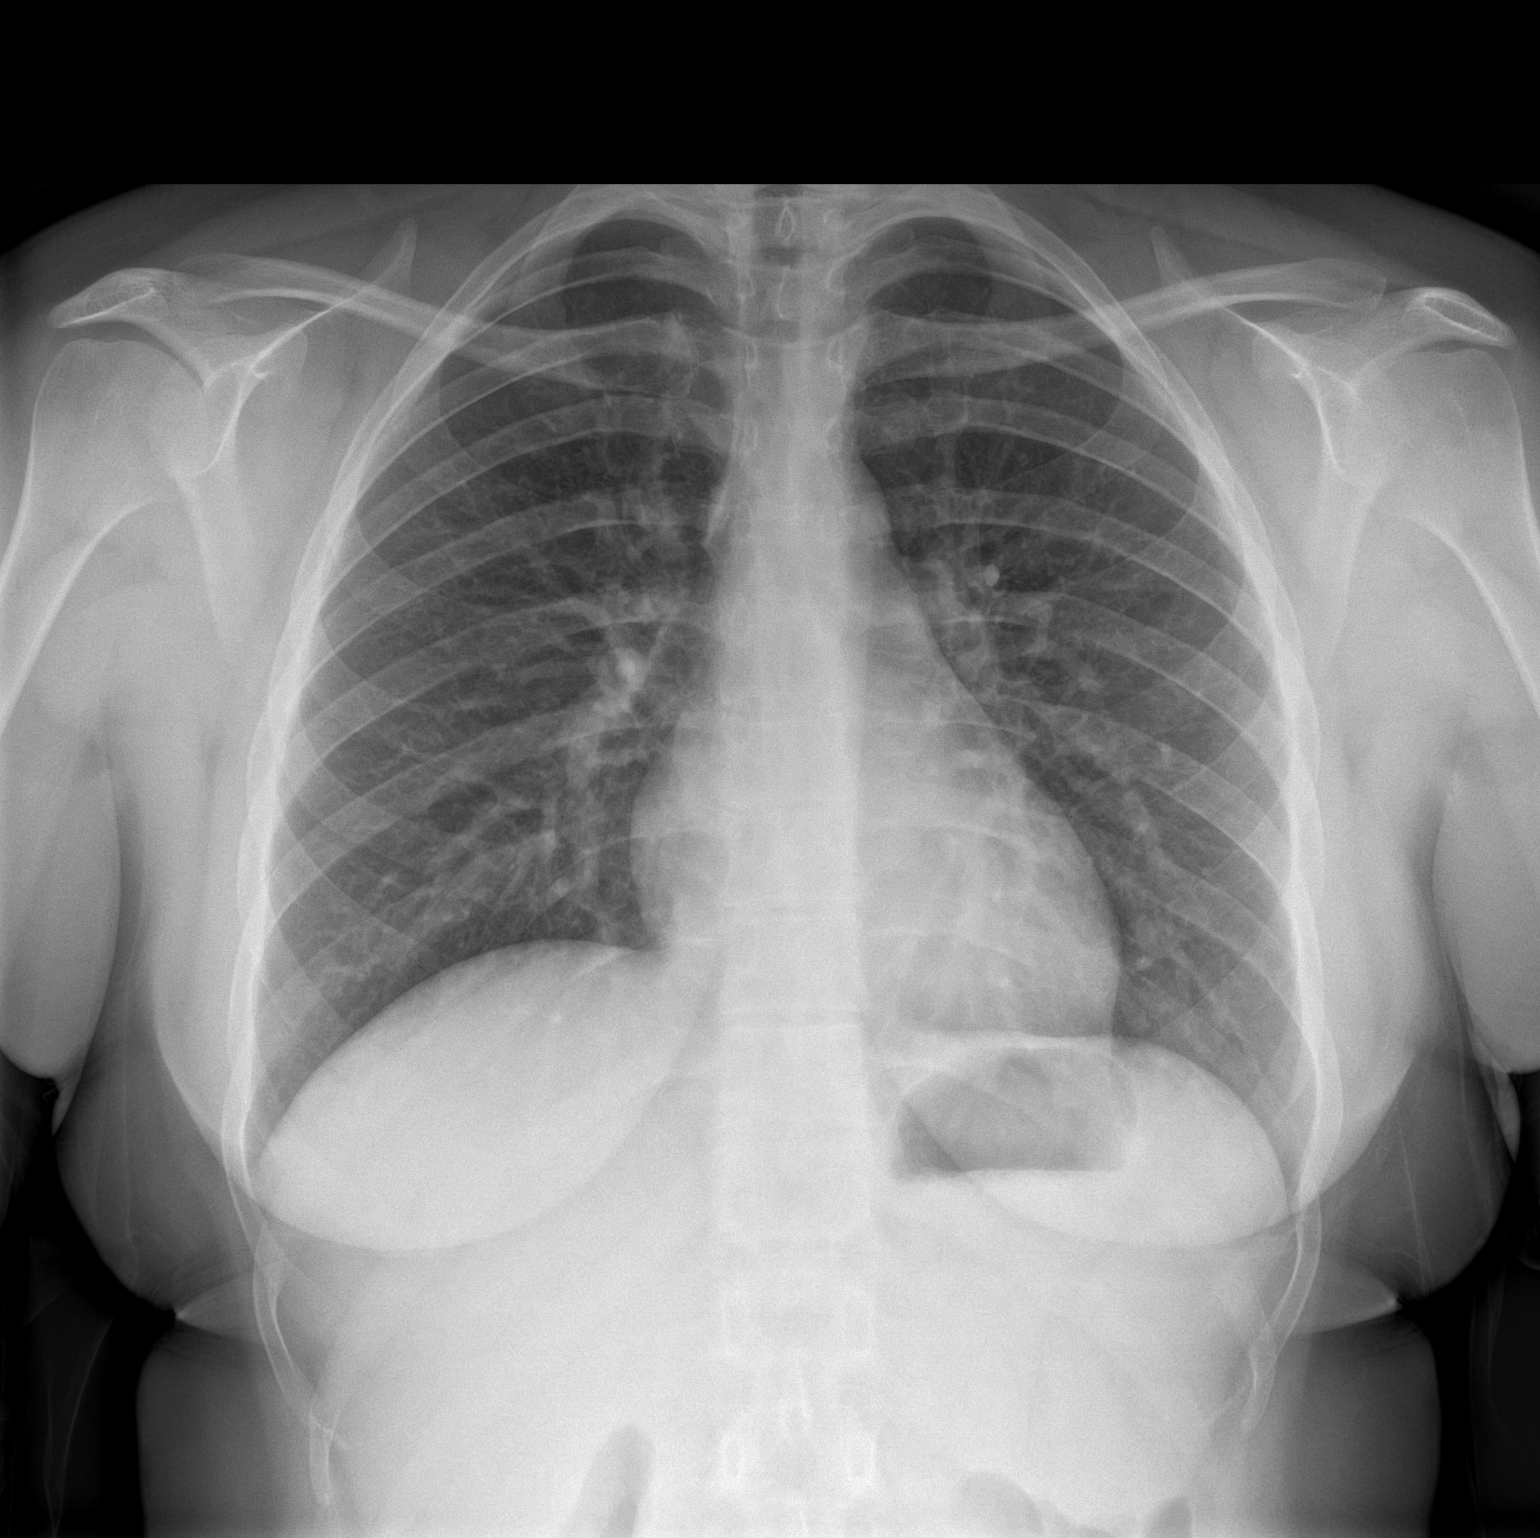

[chest lat]
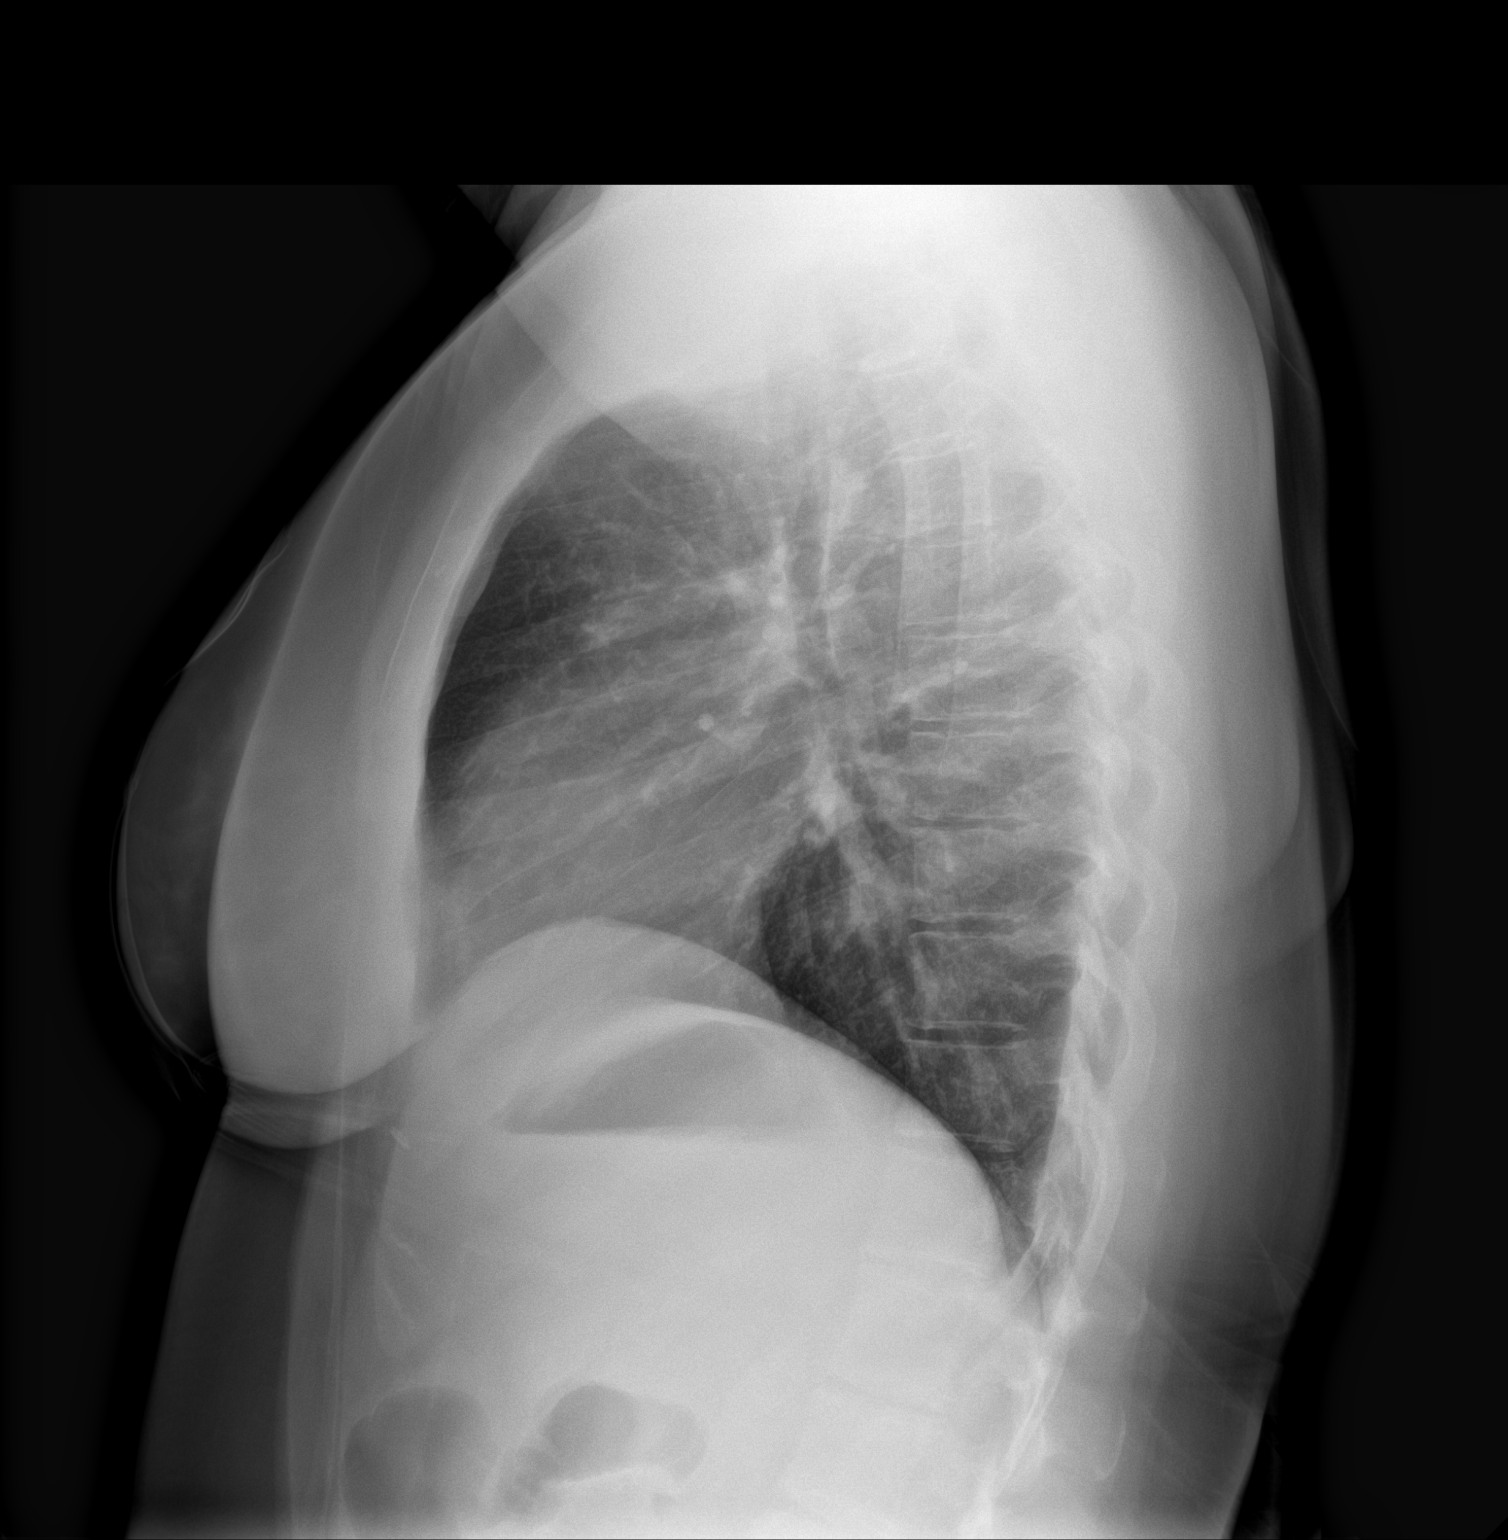

[2 of 2 positions shown; findings below may reference images not displayed]

FINDINGS: The heart size and mediastinal contours are within normal limits.
Both lungs are clear. The visualized skeletal structures are
unremarkable.
IMPRESSION: Normal chest radiograph.

## 2023-02-23 ENCOUNTER — Encounter: Payer: Self-pay | Admitting: Nurse Practitioner

## 2023-03-17 ENCOUNTER — Telehealth: Payer: Self-pay | Admitting: Nurse Practitioner

## 2023-03-17 ENCOUNTER — Encounter: Payer: Self-pay | Admitting: Nurse Practitioner

## 2023-03-17 VITALS — Ht 64.0 in | Wt 185.0 lb

## 2023-03-17 DIAGNOSIS — K580 Irritable bowel syndrome with diarrhea: Secondary | ICD-10-CM

## 2023-03-17 DIAGNOSIS — F4001 Agoraphobia with panic disorder: Secondary | ICD-10-CM

## 2023-03-17 DIAGNOSIS — E063 Autoimmune thyroiditis: Secondary | ICD-10-CM

## 2023-03-17 MED ORDER — ESCITALOPRAM OXALATE 5 MG PO TABS: 5.0000 mg | ORAL_TABLET | Freq: Every day | ORAL | 3 refills | Status: DC

## 2023-03-17 NOTE — Patient Instructions (Addendum)
[facility] location  [address], [place], [place] [phone]   You can go and have your labs done at your convenience. I have placed the orders for the future. You may want to call and make sure they don't need me to fax the orders over and to ensure you dont need an appointment.   I have sent in the Lexapro to the pharmacy for you to start. I have sent a mychart message to come to you in 2 week to let me know how you are doing.   Have fun with your boyfriend! I hope the visit is wonderful!!

## 2023-03-17 NOTE — Assessment & Plan Note (Signed)
Anxiety primarily in social situations, potentially exacerbated by thyroid issues. Linked with gastrointestinal symptoms, suggesting possible IBS related to anxiety. Discussed counseling and medication options, including sertraline or Lexapro, which are effective in low doses and generally well-tolerated. Medication can be discontinued immediately if adverse effects occur while starting. Counseling can provide skills to manage anxiety-producing thoughts and symptoms. Medication may be needed short-term or long-term depending on response. - Send referral for counseling. - Prescribe Lexapro (low dose). - Follow up in two weeks to assess medication efficacy.

## 2023-03-17 NOTE — Assessment & Plan Note (Signed)
Currently on levothyroxine. Symptoms potentially related to thyroid function, such as anxiety and gastrointestinal issues. Last thyroid function test was a year ago. TSH and T4 levels will be checked to reassess thyroid function. - Order TSH and T4 lab tests to reassess thyroid function.

## 2023-03-17 NOTE — Progress Notes (Signed)
Virtual Visit Encounter mychart visit.   I connected with  Kristie Bean on 03/17/23 at 11:45 AM EST by secure video and audio telemedicine application. I verified that I am speaking with the correct person using two identifiers.   I introduced myself as a Publishing rights manager with the practice. The limitations of evaluation and management by telemedicine discussed with the patient and the availability of in person appointments. The patient expressed verbal understanding and consent to proceed.  Participating parties in this visit include: Myself and patient  The patient is: Patient Location: Home I am: Provider Location: Office/Clinic Subjective:    CC and HPI: Kristie Bean is a 23 y.o. year old female presenting for follow up of anxiety and thyroid.   MC Message: "I just had a question about some things I've been struggling with for a few months now, I've always had anxiety but for the better half of 2024 it turned to be social and surrounding worries about stomach issues. It mainly started after my levothyroxine was too high which caused said issues but even after we brought it back down the thoughts never left. I find it genuinely hard to be excited about socializing now as I constantly worry and think "what if I have stomach problems again?". It sounds silly but it truly takes over and often causes the issues themselves to start. I feel stuck in a cycle and I'm unsure how to get out of it and start enjoying life worry-free again. If you had any advice or suggestions I would greatly appreciate it! "  History of Present Illness Kristie Bean is a 23 year old female with anxiety and thyroid issues who presents with anxiety and gastrointestinal symptoms.  She experiences anxiety, particularly in social situations, which she feels may be related to her thyroid condition. There is a persistent underlying anxiety that has been present for a long time and may have worsened with age. Her  anxiety often follows episodes of stomach upset, suggesting a possible link between the two.  She experiences gastrointestinal symptoms including diarrhea, cramping, and bloating, which she associates with anxiety-producing events. These symptoms can occur unexpectedly, leading to increased anxiety.  Her past medical history includes thyroid issues, for which she is currently taking levothyroxine. She is currently splitting the dose. It has been a year since her thyroid levels were last checked.  She has previously used propranolol for anxiety and palpitations related to her thyroid condition. She feels that this is effective some of the time, but not all of the time. She plans to keep it on hand, but does not feel she can rely on its effectiveness.  Past medical history, Surgical history, Family history not pertinant except as noted below, Social history, Allergies, and medications have been entered into the medical record, reviewed, and corrections made.   Review of Systems:  All review of systems negative except what is listed in the HPI  Objective:    Alert and oriented x 4 Speaking in clear sentences with no shortness of breath. No distress.  Impression and Recommendations:    Problem List Items Addressed This Visit     Hypothyroidism due to Hashimoto's thyroiditis - Primary   Currently on levothyroxine. Symptoms potentially related to thyroid function, such as anxiety and gastrointestinal issues. Last thyroid function test was a year ago. TSH and T4 levels will be checked to reassess thyroid function. - Order TSH and T4 lab tests to reassess thyroid function.       Relevant  Medications   levothyroxine (SYNTHROID) 125 MCG tablet   Other Relevant Orders   TSH   T4, free   Panic disorder with agoraphobia   Anxiety primarily in social situations, potentially exacerbated by thyroid issues. Linked with gastrointestinal symptoms, suggesting possible IBS related to anxiety.  Discussed counseling and medication options, including sertraline or Lexapro, which are effective in low doses and generally well-tolerated. Medication can be discontinued immediately if adverse effects occur while starting. Counseling can provide skills to manage anxiety-producing thoughts and symptoms. Medication may be needed short-term or long-term depending on response. - Send referral for counseling. - Prescribe Lexapro (low dose). - Follow up in two weeks to assess medication efficacy.      Relevant Medications   escitalopram (LEXAPRO) 5 MG tablet   Other Relevant Orders   Ambulatory referral to Psychology   Irritable bowel syndrome with diarrhea   Gastrointestinal symptoms, including diarrhea, potentially triggered by anxiety. Discussed correlation between anxiety and gastrointestinal issues, and potential for IBS. Medications for anxiety might help alleviate gastrointestinal symptoms. Recommended Imodium as needed for gastrointestinal symptoms, noting it is safe and effective for anxiety-induced stomach upset. - Recommend Imodium as needed for gastrointestinal symptoms.       orders and follow up as documented in EMR I discussed the assessment and treatment plan with the patient. The patient was provided an opportunity to ask questions and all were answered. The patient agreed with the plan and demonstrated an understanding of the instructions.   The patient was advised to call back or seek an in-person evaluation if the symptoms worsen or if the condition fails to improve as anticipated.  Follow-Up: in 2 weeks- [facility] message  I provided 15 minutes of non-face-to-face interaction with this non face-to-face encounter including intake, same-day documentation, and chart review.   Tollie Eth, NP , DNP, AGNP-c [facility]

## 2023-03-17 NOTE — Assessment & Plan Note (Signed)
Gastrointestinal symptoms, including diarrhea, potentially triggered by anxiety. Discussed correlation between anxiety and gastrointestinal issues, and potential for IBS. Medications for anxiety might help alleviate gastrointestinal symptoms. Recommended Imodium as needed for gastrointestinal symptoms, noting it is safe and effective for anxiety-induced stomach upset. - Recommend Imodium as needed for gastrointestinal symptoms.

## 2023-07-12 ENCOUNTER — Other Ambulatory Visit: Payer: Self-pay | Admitting: Nurse Practitioner

## 2023-07-20 ENCOUNTER — Encounter: Payer: Self-pay | Admitting: Nurse Practitioner

## 2023-07-21 ENCOUNTER — Other Ambulatory Visit: Payer: Self-pay

## 2023-07-21 MED ORDER — LEVOTHYROXINE SODIUM 125 MCG PO TABS: 125.0000 ug | ORAL_TABLET | Freq: Every day | ORAL | 1 refills | Status: DC

## 2023-09-15 ENCOUNTER — Encounter: Payer: Self-pay | Admitting: Nurse Practitioner

## 2023-09-15 ENCOUNTER — Ambulatory Visit: Payer: Self-pay | Admitting: Nurse Practitioner

## 2023-09-15 VITALS — BP 120/78 | HR 80 | Wt 189.2 lb

## 2023-09-15 DIAGNOSIS — E063 Autoimmune thyroiditis: Secondary | ICD-10-CM

## 2023-09-15 DIAGNOSIS — E559 Vitamin D deficiency, unspecified: Secondary | ICD-10-CM | POA: Diagnosis not present

## 2023-09-15 MED ORDER — LEVOTHYROXINE SODIUM 125 MCG PO TABS: ORAL_TABLET | ORAL | Status: DC

## 2023-09-15 NOTE — Progress Notes (Signed)
 Catheline Doing, DNP, AGNP-c [facility]  [address] [place], [place] [address] [phone]  ESTABLISHED PATIENT- Chronic Health and/or Follow-Up Visit  Blood pressure 120/78, pulse 80, weight 189 lb 3.2 oz (85.8 kg), last menstrual period 08/20/2023.    History of Present Illness Kristie Bean is a 23 year old female with Hashimoto's thyroiditis who presents for a medication follow-up and lab work.  She missed her previous lab work appointment due to significant life changes, including a job change and relocation. She is interested in rescheduling her lab work to check her thyroid  levels, as it has been longer than recommended since her last check.  She feels generally well and notes that having a stable routine and diet has improved her overall well-being. She denies palpitations, feeling excessively hot or cold, changes in bowel or bladder habits, throat fullness, or swelling in her feet. Her mood has significantly improved since her last visit. She is not taking any medication for mood at this time.   She has been experimenting with a gluten-free diet for almost two months but has not noticed significant changes. Her current medication uses corn as a filler, which she confirmed with her pharmacy- she is ok continuing.   Her current medication regimen for levothyroxine  is 125 mcg, taken as a full tablet three days a week and half a tablet four days a week. She is not currently taking propranolol .  She recently got engaged and moved back to [place] for a new job with [employer], working in Airline pilot support for a Economist. Her fiance is in [place], but is working on a work visa to move to the [place] . She is excited about her future.   All ROS negative with exception of what is listed above.   PHYSICAL EXAM Physical Exam Vitals and nursing note reviewed.  Constitutional:      Appearance: Normal appearance.  HENT:     Head: Normocephalic.  Eyes:      Pupils: Pupils are equal, round, and reactive to light.  Neck:     Vascular: No carotid bruit.  Cardiovascular:     Rate and Rhythm: Normal rate and regular rhythm.     Pulses: Normal pulses.     Heart sounds: Normal heart sounds.  Pulmonary:     Effort: Pulmonary effort is normal.     Breath sounds: Normal breath sounds.  Abdominal:     General: Bowel sounds are normal. There is no distension.     Palpations: Abdomen is soft.     Tenderness: There is no abdominal tenderness. There is no guarding or rebound.  Musculoskeletal:        General: Normal range of motion.     Cervical back: Normal range of motion and neck supple. No rigidity or tenderness.     Right lower leg: No edema.     Left lower leg: No edema.  Lymphadenopathy:     Cervical: No cervical adenopathy.  Skin:    General: Skin is warm.     Capillary Refill: Capillary refill takes less than 2 seconds.  Neurological:     General: No focal deficit present.     Mental Status: She is alert and oriented to person, place, and time.     Sensory: No sensory deficit.     Motor: No weakness.  Psychiatric:        Mood and Affect: Mood normal.        Behavior: Behavior normal.      PLAN Problem  List Items Addressed This Visit     Vitamin D insufficiency   Repeat labs for monitoring today in the setting of hypothyroidism.       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 She reports improvement with a well-managed routine and diet. No significant symptoms such as palpitations, temperature intolerance, or changes in bowel or bladder habits. She has been on a gluten-free diet for two months without noticeable changes. Missed previous lab appointment due to life changes and is interested in rescheduling. Current medication regimen is levothyroxine  125 mcg, taken as half a tab for four days and a full tab for three days. Discussed the presence of  gluten in some levothyroxine  formulations and the availability of a gluten-free option, which is more expensive. Antibodies for Hashimoto's are present and do not require rechecking as they remain constant. - Order TSH, free T4, and vitamin D levels. - Provide lab requisition forms for her to have labs drawn at a convenient location. - Continue current levothyroxine  regimen: half a tab for four days, full tab for three days. - Discussed the option of gluten-free levothyroxine  if needed, noting the higher cost.      Relevant Medications   levothyroxine  (SYNTHROID ) 125 MCG tablet   Other Relevant Orders   VITAMIN D 25 Hydroxy (Vit-D Deficiency, Fractures)   TSH + free T4    Return in about 6 months (around 03/17/2024) for CPE.  Catheline Doing, DNP, AGNP-c

## 2023-09-15 NOTE — Assessment & Plan Note (Signed)
 Repeat labs for monitoring today in the setting of hypothyroidism.

## 2023-09-15 NOTE — Patient Instructions (Addendum)
 You can have the labs completed at your convenience. You can go to one of the [facility] offices or come back here to have this done. I will let you know if we need to make changes based on the labs  We will plan your physical in February. If you need to see me sooner, please let me know.

## 2023-09-15 NOTE — Assessment & Plan Note (Signed)
 Hashimoto's thyroiditis with hypothyroidism, previously diagnosed. She reports improvement with a well-managed routine and diet. No significant symptoms such as palpitations, temperature intolerance, or changes in bowel or bladder habits. She has been on a gluten-free diet for two months without noticeable changes. Missed previous lab appointment due to life changes and is interested in rescheduling. Current medication regimen is levothyroxine  125 mcg, taken as half a tab for four days and a full tab for three days. Discussed the presence of gluten in some levothyroxine  formulations and the availability of a gluten-free option, which is more expensive. Antibodies for Hashimoto's are present and do not require rechecking as they remain constant. - Order TSH, free T4, and vitamin D levels. - Provide lab requisition forms for her to have labs drawn at a convenient location. - Continue current levothyroxine  regimen: half a tab for four days, full tab for three days. - Discussed the option of gluten-free levothyroxine  if needed, noting the higher cost.

## 2023-09-28 ENCOUNTER — Other Ambulatory Visit: Payer: Self-pay

## 2023-09-28 ENCOUNTER — Other Ambulatory Visit: Payer: Self-pay | Admitting: Nurse Practitioner

## 2023-09-28 DIAGNOSIS — E063 Autoimmune thyroiditis: Secondary | ICD-10-CM

## 2023-09-28 MED ORDER — LEVOTHYROXINE SODIUM 125 MCG PO TABS: ORAL_TABLET | ORAL | 2 refills | Status: DC

## 2024-02-04 ENCOUNTER — Other Ambulatory Visit: Payer: Self-pay | Admitting: Nurse Practitioner

## 2024-02-04 DIAGNOSIS — E063 Autoimmune thyroiditis: Secondary | ICD-10-CM

## 2024-02-24 ENCOUNTER — Encounter: Payer: Self-pay | Admitting: Nurse Practitioner

## 2024-02-24 ENCOUNTER — Other Ambulatory Visit: Payer: Self-pay

## 2024-02-24 DIAGNOSIS — E063 Autoimmune thyroiditis: Secondary | ICD-10-CM

## 2024-02-24 MED ORDER — LEVOTHYROXINE SODIUM 125 MCG PO TABS: ORAL_TABLET | ORAL | 0 refills | Status: AC

## 2024-03-22 ENCOUNTER — Encounter: Admitting: Nurse Practitioner

## 2024-04-01 ENCOUNTER — Encounter: Payer: Self-pay | Admitting: Nurse Practitioner
